# Patient Record
Sex: Female | Born: 1985 | Race: Black or African American | Hispanic: No | Marital: Married | Smoking: Never smoker
Health system: Southern US, Community
[De-identification: ages and names within clinical notes are randomized; demographics above are authoritative.]

## PROBLEM LIST (undated history)

## (undated) ENCOUNTER — Inpatient Hospital Stay (HOSPITAL_COMMUNITY): Payer: Self-pay

## (undated) DIAGNOSIS — A6004 Herpesviral vulvovaginitis: Secondary | ICD-10-CM

## (undated) DIAGNOSIS — F32A Depression, unspecified: Secondary | ICD-10-CM

## (undated) DIAGNOSIS — R519 Headache, unspecified: Secondary | ICD-10-CM

## (undated) DIAGNOSIS — F419 Anxiety disorder, unspecified: Secondary | ICD-10-CM

## (undated) DIAGNOSIS — R87629 Unspecified abnormal cytological findings in specimens from vagina: Secondary | ICD-10-CM

## (undated) DIAGNOSIS — N871 Moderate cervical dysplasia: Secondary | ICD-10-CM

## (undated) DIAGNOSIS — N870 Mild cervical dysplasia: Secondary | ICD-10-CM

## (undated) HISTORY — DX: Depression, unspecified: F32.A

## (undated) HISTORY — PX: DILATION AND CURETTAGE, DIAGNOSTIC / THERAPEUTIC: SUR384

## (undated) HISTORY — DX: Anxiety disorder, unspecified: F41.9

## (undated) HISTORY — DX: Unspecified abnormal cytological findings in specimens from vagina: R87.629

## (undated) HISTORY — DX: Headache, unspecified: R51.9

## (undated) HISTORY — DX: Moderate cervical dysplasia: N87.1

---
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
## 2005-09-15 ENCOUNTER — Emergency Department (HOSPITAL_COMMUNITY): Admission: EM | Admit: 2005-09-15 | Discharge: 2005-09-16 | Payer: Self-pay | Admitting: Emergency Medicine

## 2006-11-07 IMAGING — CR DG KNEE COMPLETE 4+V*R*
4 series · 4 of 4 positions shown · non-contrast
Comparison: none

CLINICAL DATA: Knee pinned between 2 cars.  Crushing injury.  Lateral knee pain.  
 RIGHT KNEE - 4 VIEW:
 There is no evidence of fracture, dislocation or joint effusion.  There is no evidence of arthropathy or other focal bone abnormality.  Soft tissues are unremarkable.

[t knee ap right]
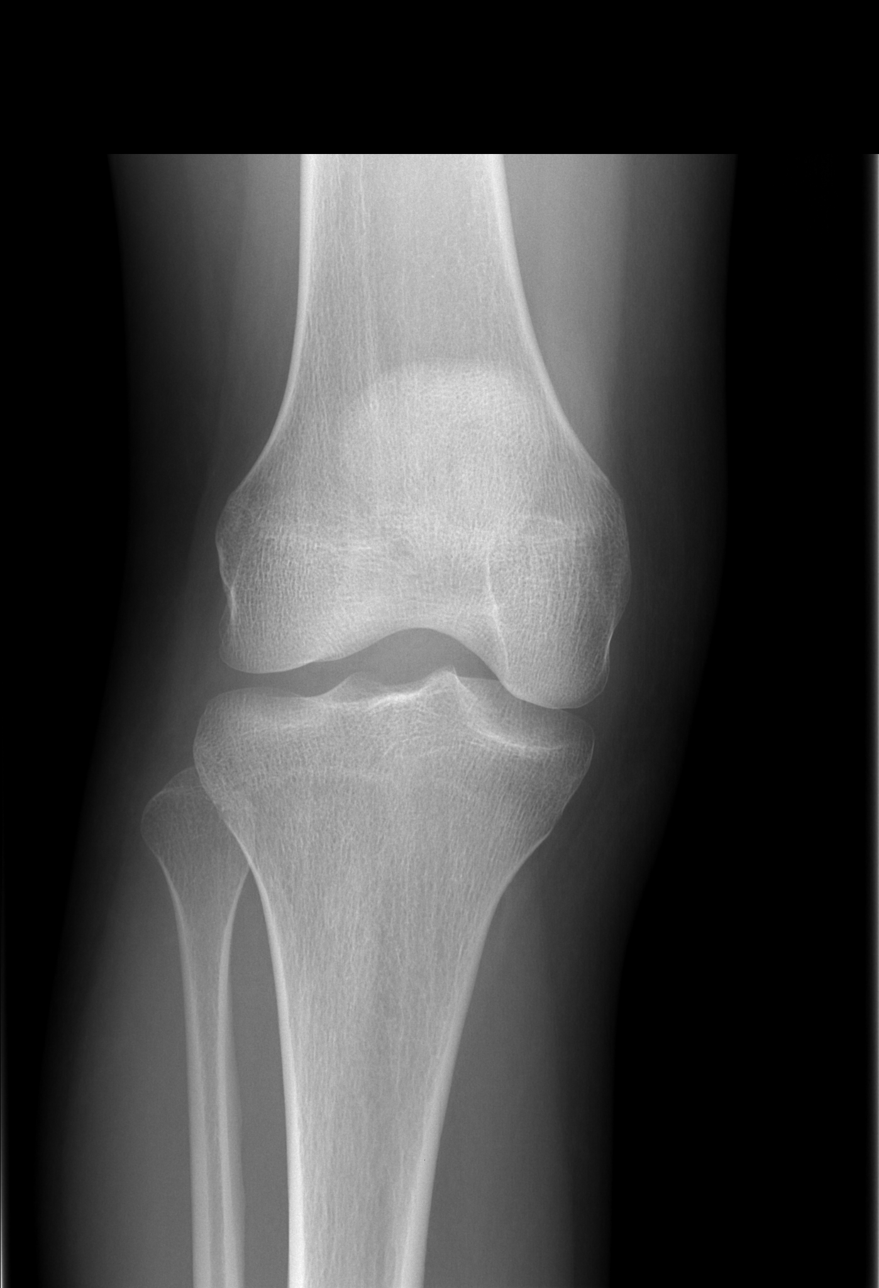

[t knee oblique right (1 of 2)]
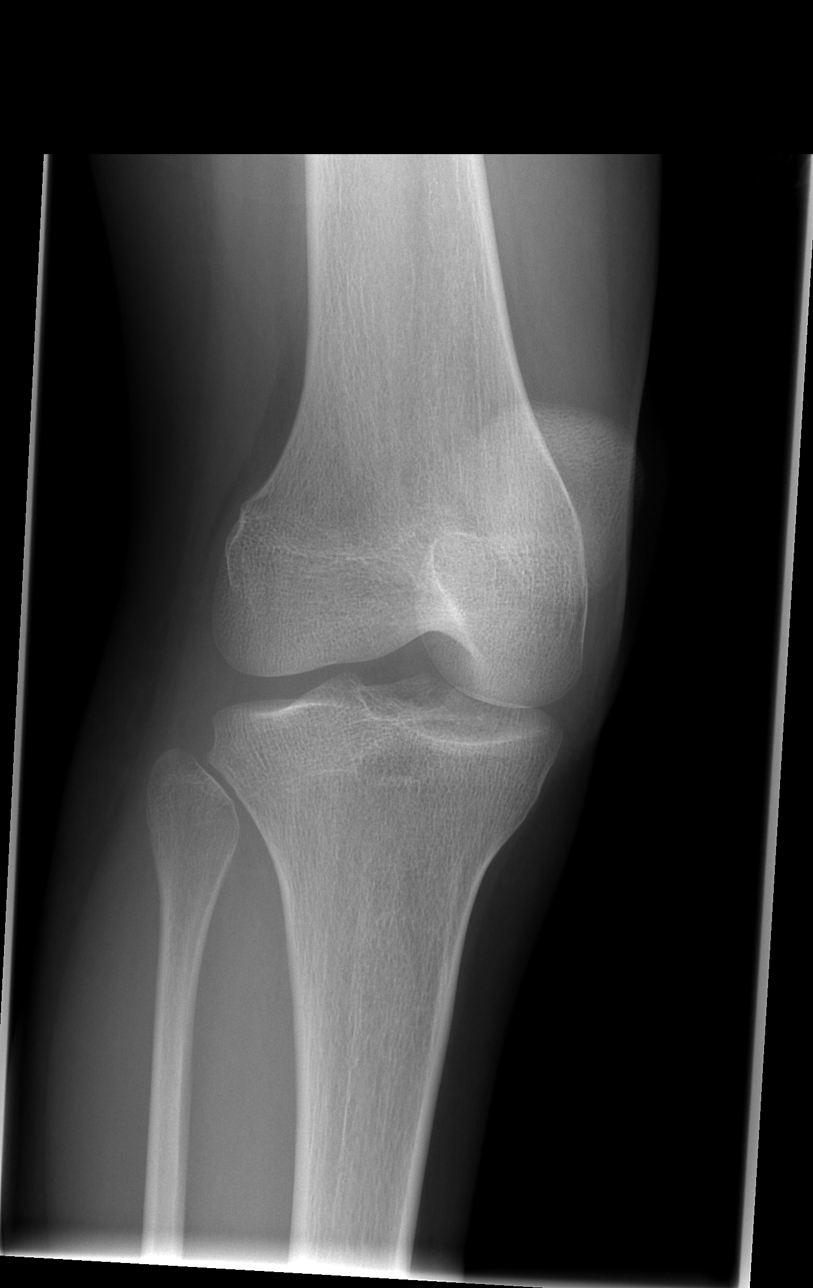

[t knee oblique right (2 of 2)]
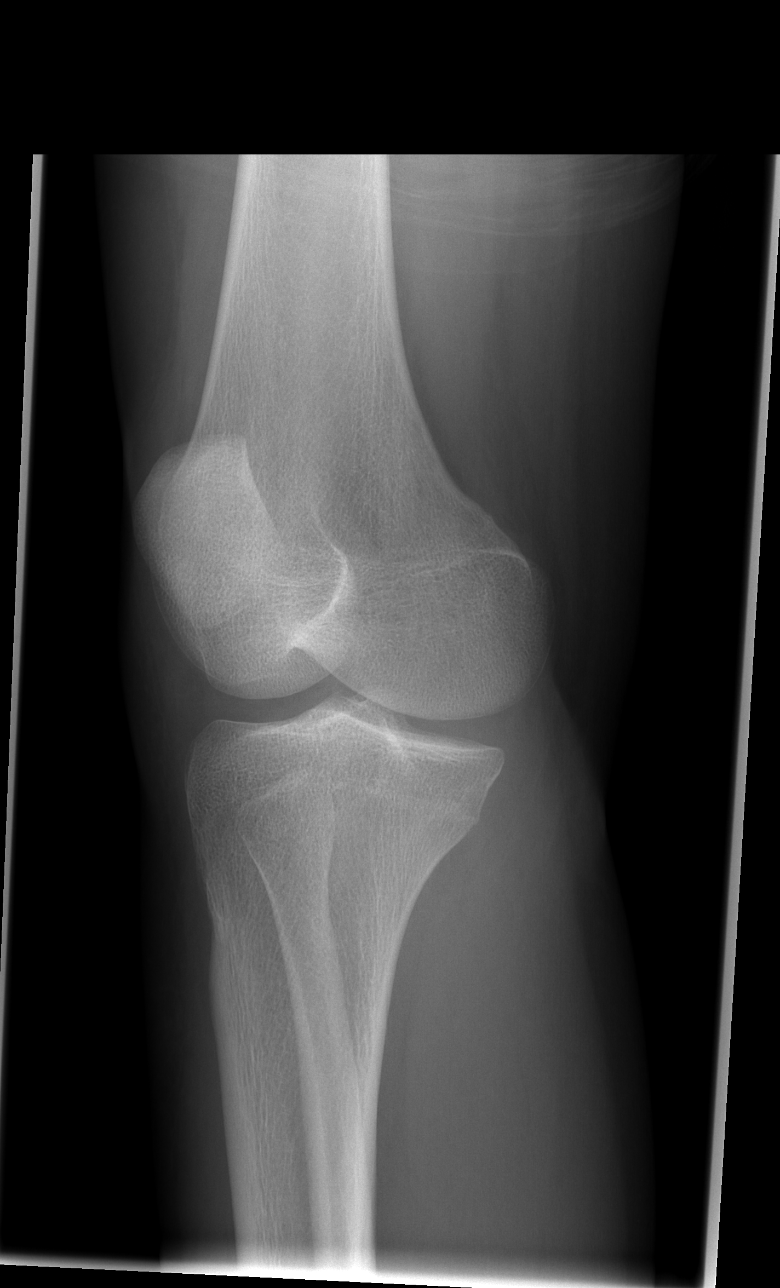

[t knee lat right]
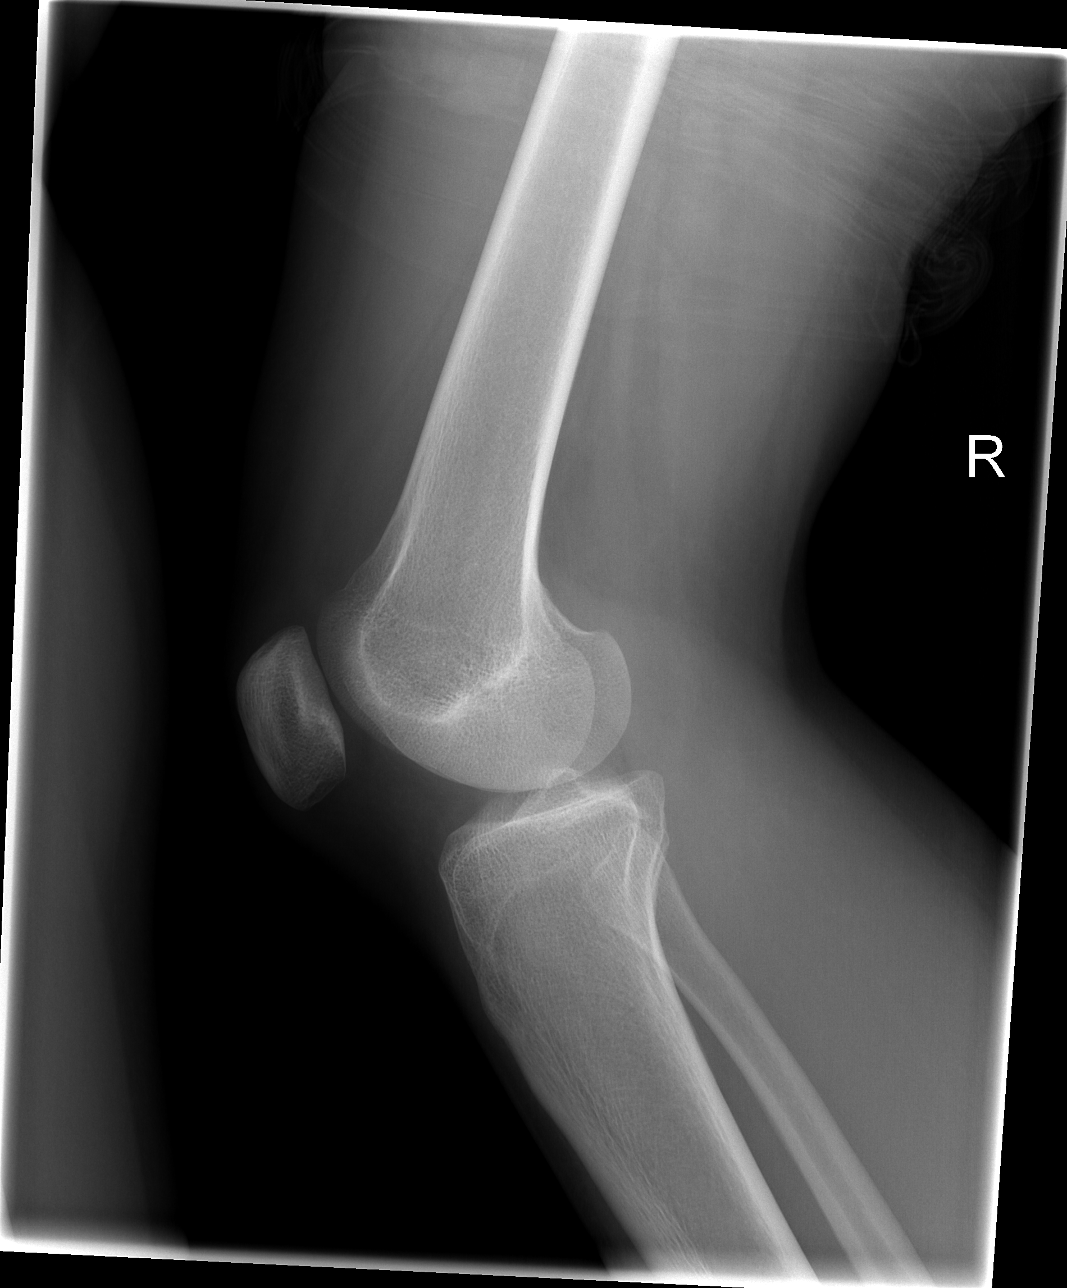

[4 of 4 positions shown; findings below may reference images not displayed]

IMPRESSION: Negative.

## 2006-12-25 ENCOUNTER — Emergency Department (HOSPITAL_COMMUNITY): Admission: EM | Admit: 2006-12-25 | Discharge: 2006-12-25 | Payer: Self-pay | Admitting: *Deleted

## 2008-08-27 ENCOUNTER — Emergency Department (HOSPITAL_COMMUNITY): Admission: EM | Admit: 2008-08-27 | Discharge: 2008-08-27 | Payer: Self-pay | Admitting: Emergency Medicine

## 2008-10-23 ENCOUNTER — Emergency Department (HOSPITAL_COMMUNITY): Admission: EM | Admit: 2008-10-23 | Discharge: 2008-10-23 | Payer: Self-pay | Admitting: Emergency Medicine

## 2008-12-13 ENCOUNTER — Emergency Department (HOSPITAL_COMMUNITY): Admission: EM | Admit: 2008-12-13 | Discharge: 2008-12-13 | Payer: Self-pay | Admitting: Emergency Medicine

## 2008-12-15 ENCOUNTER — Emergency Department (HOSPITAL_COMMUNITY): Admission: EM | Admit: 2008-12-15 | Discharge: 2008-12-15 | Payer: Self-pay | Admitting: Emergency Medicine

## 2009-03-15 ENCOUNTER — Emergency Department (HOSPITAL_COMMUNITY): Admission: EM | Admit: 2009-03-15 | Discharge: 2009-03-16 | Payer: Self-pay | Admitting: Emergency Medicine

## 2009-08-10 ENCOUNTER — Ambulatory Visit: Payer: Self-pay | Admitting: Obstetrics and Gynecology

## 2009-08-10 ENCOUNTER — Encounter (INDEPENDENT_AMBULATORY_CARE_PROVIDER_SITE_OTHER): Payer: Self-pay | Admitting: *Deleted

## 2009-08-10 LAB — CONVERTED CEMR LAB

## 2009-08-13 ENCOUNTER — Encounter (INDEPENDENT_AMBULATORY_CARE_PROVIDER_SITE_OTHER): Payer: Self-pay | Admitting: *Deleted

## 2009-08-13 LAB — CONVERTED CEMR LAB
Trich, Wet Prep: NONE SEEN
Yeast Wet Prep HPF POC: NONE SEEN

## 2009-10-05 ENCOUNTER — Other Ambulatory Visit: Admission: RE | Admit: 2009-10-05 | Discharge: 2009-10-05 | Payer: Self-pay | Admitting: Obstetrics and Gynecology

## 2009-10-05 ENCOUNTER — Ambulatory Visit: Payer: Self-pay | Admitting: Obstetrics and Gynecology

## 2009-10-26 ENCOUNTER — Ambulatory Visit: Payer: Self-pay | Admitting: Family Medicine

## 2009-12-07 ENCOUNTER — Inpatient Hospital Stay (HOSPITAL_COMMUNITY): Admission: AD | Admit: 2009-12-07 | Discharge: 2009-12-07 | Payer: Self-pay | Admitting: Obstetrics and Gynecology

## 2009-12-27 LAB — HEPATITIS B SURFACE ANTIGEN: Hepatitis B Surface Ag: NEGATIVE

## 2009-12-27 LAB — RPR: RPR: NONREACTIVE

## 2009-12-27 LAB — ABO/RH

## 2010-01-13 NOTE — L&D Delivery Note (Signed)
Delivery Note At 11:57 AM a viable unspecified sex was delivered via Vaginal, Spontaneous Delivery (OA  ).  APGAR:9 , 9; weight .   Placenta status: , .  Cord:  with the following complications: .  Cord pH: none  Anesthesia: Epidural  Episiotomy: None Lacerations: none Suture Repair: none Est. Blood Loss (mL):   Mom to postpartum.  Baby to nursery-stable.  Lisandro Meggett A 08/08/2010, 12:14 AM

## 2010-03-26 LAB — URINALYSIS, ROUTINE W REFLEX MICROSCOPIC
Hgb urine dipstick: NEGATIVE
Nitrite: NEGATIVE
Urobilinogen, UA: 0.2 mg/dL (ref 0.0–1.0)
pH: 6 (ref 5.0–8.0)

## 2010-03-26 LAB — POCT PREGNANCY, URINE: Preg Test, Ur: POSITIVE

## 2010-03-28 LAB — POCT PREGNANCY, URINE: Preg Test, Ur: NEGATIVE

## 2010-04-02 LAB — STREP B DNA PROBE: GBS: POSITIVE

## 2010-04-16 LAB — RAPID STREP SCREEN (MED CTR MEBANE ONLY): Streptococcus, Group A Screen (Direct): NEGATIVE

## 2010-04-18 LAB — RPR: RPR Ser Ql: NONREACTIVE

## 2010-04-28 ENCOUNTER — Inpatient Hospital Stay (HOSPITAL_COMMUNITY)
Admission: AD | Admit: 2010-04-28 | Discharge: 2010-04-28 | Disposition: A | Discharge status: Home / Self Care | Payer: BC Managed Care – HMO | Source: Ambulatory Visit | Attending: Obstetrics & Gynecology | Admitting: Obstetrics & Gynecology

## 2010-04-28 DIAGNOSIS — O368190 Decreased fetal movements, unspecified trimester, not applicable or unspecified: Secondary | ICD-10-CM | POA: Insufficient documentation

## 2010-05-15 LAB — RPR: RPR: NONREACTIVE

## 2010-08-06 ENCOUNTER — Inpatient Hospital Stay (HOSPITAL_COMMUNITY)
Admission: AD | Admit: 2010-08-06 | Discharge: 2010-08-09 | DRG: 775 | Disposition: A | Discharge status: Home / Self Care | Payer: Medicaid Other | Source: Ambulatory Visit | Attending: Obstetrics and Gynecology | Admitting: Obstetrics and Gynecology

## 2010-08-06 ENCOUNTER — Encounter (HOSPITAL_COMMUNITY): Payer: Self-pay | Admitting: *Deleted

## 2010-08-06 DIAGNOSIS — O99892 Other specified diseases and conditions complicating childbirth: Secondary | ICD-10-CM | POA: Diagnosis present

## 2010-08-06 DIAGNOSIS — O480 Post-term pregnancy: Secondary | ICD-10-CM | POA: Insufficient documentation

## 2010-08-06 DIAGNOSIS — Z22330 Carrier of Group B streptococcus: Secondary | ICD-10-CM

## 2010-08-06 HISTORY — DX: Mild cervical dysplasia: N87.0

## 2010-08-06 HISTORY — DX: Herpesviral vulvovaginitis: A60.04

## 2010-08-06 LAB — CBC
Hemoglobin: 12.4 g/dL (ref 12.0–15.0)
RBC: 4.37 MIL/uL (ref 3.87–5.11)

## 2010-08-06 MED ORDER — LACTATED RINGERS IV SOLN
INTRAVENOUS | Status: DC
  Administered 2010-08-07: 07:00:00 via INTRAVENOUS

## 2010-08-06 MED ORDER — IBUPROFEN 600 MG PO TABS: 600.0000 mg | ORAL_TABLET | Freq: Four times a day (QID) | ORAL | Status: DC | PRN

## 2010-08-06 MED ORDER — FLEET ENEMA 7-19 GM/118ML RE ENEM: 1.0000 | ENEMA | RECTAL | Status: DC | PRN

## 2010-08-06 MED ORDER — PENICILLIN G POTASSIUM 5000000 UNITS IJ SOLR
5.0000 10*6.[IU] | Freq: Once | INTRAVENOUS | Status: DC
  Filled 2010-08-06: qty 5

## 2010-08-06 MED ORDER — ACETAMINOPHEN 325 MG PO TABS: 650.0000 mg | ORAL_TABLET | ORAL | Status: DC | PRN

## 2010-08-06 MED ORDER — ZOLPIDEM TARTRATE 10 MG PO TABS: 10.0000 mg | ORAL_TABLET | Freq: Every evening | ORAL | Status: DC | PRN

## 2010-08-06 MED ORDER — PENICILLIN G POTASSIUM 5000000 UNITS IJ SOLR
2.5000 10*6.[IU] | INTRAVENOUS | Status: DC
  Filled 2010-08-06 (×5): qty 2.5

## 2010-08-06 MED ORDER — MISOPROSTOL 25 MCG QUARTER TABLET
25.0000 ug | ORAL_TABLET | ORAL | Status: DC | PRN
  Administered 2010-08-06 – 2010-08-07 (×2): 25 ug via VAGINAL
  Filled 2010-08-06 (×2): qty 0.25

## 2010-08-06 MED ORDER — LACTATED RINGERS IV SOLN: 500.0000 mL | INTRAVENOUS | Status: DC | PRN

## 2010-08-06 MED ORDER — SODIUM CHLORIDE 0.9 % IJ SOLN
3.0000 mL | Freq: Two times a day (BID) | INTRAMUSCULAR | Status: DC
  Administered 2010-08-06 – 2010-08-07 (×2): 3 mL via INTRAVENOUS

## 2010-08-06 MED ORDER — OXYCODONE-ACETAMINOPHEN 5-325 MG PO TABS: 2.0000 | ORAL_TABLET | ORAL | Status: DC | PRN

## 2010-08-06 MED ORDER — LIDOCAINE HCL (PF) 1 % IJ SOLN: 30.0000 mL | INTRAMUSCULAR | Status: DC | PRN

## 2010-08-06 MED ORDER — OXYTOCIN 20 UNITS IN LACTATED RINGERS INFUSION - SIMPLE
1.0000 m[IU]/min | INTRAVENOUS | Status: DC
  Administered 2010-08-07: 14 m[IU]/min via INTRAVENOUS
  Administered 2010-08-07: 2 m[IU]/min via INTRAVENOUS
  Administered 2010-08-08: 333 m[IU]/min via INTRAVENOUS
  Filled 2010-08-06: qty 1000

## 2010-08-06 MED ORDER — TERBUTALINE SULFATE 1 MG/ML IJ SOLN: 0.2500 mg | Freq: Once | INTRAMUSCULAR | Status: AC | PRN

## 2010-08-06 MED ORDER — CITRIC ACID-SODIUM CITRATE 334-500 MG/5ML PO SOLN: 30.0000 mL | ORAL | Status: DC | PRN

## 2010-08-06 MED ORDER — ONDANSETRON HCL 4 MG/2ML IJ SOLN: 4.0000 mg | Freq: Four times a day (QID) | INTRAMUSCULAR | Status: DC | PRN

## 2010-08-06 NOTE — Plan of Care (Signed)
Problem: Consults Goal: Birthing Suites Patient Information Press F2 to bring up selections list Outcome: Completed/Met Date Met:  08/06/10  Pt > [redacted] weeks EGA and Inpatient induction

## 2010-08-07 ENCOUNTER — Inpatient Hospital Stay (HOSPITAL_COMMUNITY): Payer: Medicaid Other | Admitting: Anesthesiology

## 2010-08-07 ENCOUNTER — Encounter (HOSPITAL_COMMUNITY): Payer: Self-pay | Admitting: Anesthesiology

## 2010-08-07 ENCOUNTER — Encounter (HOSPITAL_COMMUNITY): Payer: Self-pay | Admitting: Obstetrics and Gynecology

## 2010-08-07 LAB — RPR: RPR Ser Ql: NONREACTIVE

## 2010-08-07 MED ORDER — PENICILLIN G POTASSIUM 5000000 UNITS IJ SOLR
2.5000 10*6.[IU] | INTRAVENOUS | Status: DC
  Administered 2010-08-07: 2.5 10*6.[IU] via INTRAVENOUS
  Filled 2010-08-07 (×2): qty 2.5

## 2010-08-07 MED ORDER — PHENYLEPHRINE 40 MCG/ML (10ML) SYRINGE FOR IV PUSH (FOR BLOOD PRESSURE SUPPORT)
80.0000 ug | PREFILLED_SYRINGE | INTRAVENOUS | Status: DC | PRN
  Filled 2010-08-07 (×2): qty 5

## 2010-08-07 MED ORDER — LACTATED RINGERS IV SOLN: 500.0000 mL | Freq: Once | INTRAVENOUS | Status: DC

## 2010-08-07 MED ORDER — IBUPROFEN 600 MG PO TABS: 600.0000 mg | ORAL_TABLET | Freq: Four times a day (QID) | ORAL | Status: DC | PRN

## 2010-08-07 MED ORDER — CITRIC ACID-SODIUM CITRATE 334-500 MG/5ML PO SOLN: 30.0000 mL | ORAL | Status: DC | PRN

## 2010-08-07 MED ORDER — PENICILLIN G POTASSIUM 5000000 UNITS IJ SOLR
2.5000 10*6.[IU] | INTRAVENOUS | Status: DC
  Administered 2010-08-07 (×2): 2.5 10*6.[IU] via INTRAVENOUS
  Filled 2010-08-07 (×7): qty 2.5

## 2010-08-07 MED ORDER — PHENYLEPHRINE 40 MCG/ML (10ML) SYRINGE FOR IV PUSH (FOR BLOOD PRESSURE SUPPORT)
80.0000 ug | PREFILLED_SYRINGE | INTRAVENOUS | Status: DC | PRN
  Filled 2010-08-07: qty 5

## 2010-08-07 MED ORDER — EPHEDRINE 5 MG/ML INJ
10.0000 mg | INTRAVENOUS | Status: DC | PRN
  Filled 2010-08-07 (×2): qty 4

## 2010-08-07 MED ORDER — OXYTOCIN 10 UNIT/ML IJ SOLN
INTRAMUSCULAR | Status: AC
  Filled 2010-08-07: qty 2

## 2010-08-07 MED ORDER — LACTATED RINGERS IV SOLN
500.0000 mL | INTRAVENOUS | Status: DC | PRN
  Administered 2010-08-07: 500 mL via INTRAVENOUS

## 2010-08-07 MED ORDER — ACETAMINOPHEN 325 MG PO TABS: 650.0000 mg | ORAL_TABLET | ORAL | Status: DC | PRN

## 2010-08-07 MED ORDER — PENICILLIN G POTASSIUM 5000000 UNITS IJ SOLR: 2.5000 10*6.[IU] | INTRAVENOUS | Status: DC

## 2010-08-07 MED ORDER — PENICILLIN G POTASSIUM 5000000 UNITS IJ SOLR: 5.0000 10*6.[IU] | Freq: Once | INTRAVENOUS | Status: DC

## 2010-08-07 MED ORDER — LIDOCAINE HCL (PF) 1 % IJ SOLN
30.0000 mL | INTRAMUSCULAR | Status: DC | PRN
  Filled 2010-08-07 (×2): qty 30

## 2010-08-07 MED ORDER — FENTANYL 2.5 MCG/ML BUPIVACAINE 1/10 % EPIDURAL INFUSION (WH - ANES)
14.0000 mL/h | INTRAMUSCULAR | Status: DC
  Administered 2010-08-07: 14 mL/h via EPIDURAL
  Filled 2010-08-07: qty 60

## 2010-08-07 MED ORDER — OXYTOCIN 20 UNITS IN LACTATED RINGERS INFUSION - SIMPLE
125.0000 mL/h | Freq: Once | INTRAVENOUS | Status: DC
  Filled 2010-08-07: qty 1000

## 2010-08-07 MED ORDER — ONDANSETRON HCL 4 MG/2ML IJ SOLN: 4.0000 mg | Freq: Four times a day (QID) | INTRAMUSCULAR | Status: DC | PRN

## 2010-08-07 MED ORDER — OXYCODONE-ACETAMINOPHEN 5-325 MG PO TABS: 2.0000 | ORAL_TABLET | ORAL | Status: DC | PRN

## 2010-08-07 MED ORDER — LACTATED RINGERS IV SOLN
INTRAVENOUS | Status: DC
  Administered 2010-08-07: 59 mL/h via INTRAVENOUS

## 2010-08-07 MED ORDER — PENICILLIN G POTASSIUM 5000000 UNITS IJ SOLR
5.0000 10*6.[IU] | Freq: Once | INTRAMUSCULAR | Status: DC
  Administered 2010-08-07: 5 10*6.[IU] via INTRAVENOUS
  Filled 2010-08-07: qty 5

## 2010-08-07 MED ORDER — EPHEDRINE 5 MG/ML INJ
10.0000 mg | INTRAVENOUS | Status: DC | PRN
  Filled 2010-08-07: qty 4

## 2010-08-07 MED ORDER — DIPHENHYDRAMINE HCL 50 MG/ML IJ SOLN: 12.5000 mg | INTRAMUSCULAR | Status: DC | PRN

## 2010-08-07 NOTE — Progress Notes (Signed)
S: comfortable after epidural. Pitocin 18 mIU  O: VE; (9:35 pm  5-6 cm/80%/-1 Pt on left lateral exaggerated sims  Tracing baseline 130 reactive. Ctx q 2-3 mins  A/p; Active phase Postdate GBS cx (+)  P) increase pitocin to 20 MIU  cont PCN

## 2010-08-07 NOTE — H&P (Signed)
Sherry [name] is a 25 y.o. female presenting for induction of labor 2nd to postdates. (+) GBS cx. 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 No pertinent past medical history    Past Surgical History  Procedure Date  . No past surgeries    Family History: family history is not on file. Social History:  reports that she has never smoked. She has never used smokeless tobacco. She reports that she does not drink alcohol or use illicit drugs.  Review of Systems  Genitourinary: Negative.   All other systems reviewed and are negative.    Dilation: 1.5 Effacement (%): 50 Station: -2 Exam by:: a. harper, rnc-ob Blood pressure 115/74, pulse 100, temperature 97.8 F (36.6 C), temperature source Oral, resp. rate 18, height 5\' 9"  (1.753 m), weight 110.678 kg (244 lb), last menstrual period 10/25/2009. Maternal Exam:  Abdomen: Patient reports no abdominal tenderness. Fundal height is term.   Estimated fetal weight is 8 1/2 lb.   Fetal presentation: vertex  Introitus: Normal vulva. Ferning test: not done.  Nitrazine test: not done.  Pelvis: adequate for delivery.   Cervix: Cervix evaluated by digital exam.     Physical Exam  Constitutional: She is oriented to person, place, and time. She appears well-developed and well-nourished.  HENT:  Head: Normocephalic.  Eyes: EOM are normal.  Neck: Normal range of motion.  Cardiovascular: Normal rate and regular rhythm.   Respiratory: Breath sounds normal.  GI: Soft.  Musculoskeletal: Normal range of motion.  Neurological: She is alert and oriented to person, place, and time.  Skin: Skin is warm.    Prenatal labs: ABO, Rh:  AB positive Antibody: Negative (12/15 0000) Rubella:  Indeterminate RPR: NON REACTIVE (07/24 2100)  HBsAg:  Negative (12/15 0000)  HIV: NON REAC (07/29 1959)  GBS: Positive (03/20 0000)   Assessment/Plan: Postdate GBS cx (+) P ) Admit. cytotec induction, routine labs. Analgesic. PCN prophylaxis. D/c Valtrex. Rubella vaccine postpartum  Sherry [name] A 08/07/2010, 5:34 AM    ABA

## 2010-08-07 NOTE — Progress Notes (Signed)
Sherry [name] is a 25 y.o. G2P0010 at [redacted]w[redacted]d by LMP admitted for induction of labor due to Post dates. Due date 08/01/2010. Pt has had cytotec  Subjective: No chief complaint on file.   Objective: BP 115/74  Pulse 100  Temp(Src) 97.9 F (36.6 C) (Oral)  Resp 18  Ht 5\' 9"  (1.753 m)  Wt 110.678 kg (244 lb)  BMI 36.03 kg/m2  LMP 10/25/2009      FHT:  Tracing: reactive baseline 140's (+) accel . irreg ctx UC:   irregular, every 2-3 minutes SVE:   3 cm dilated/70%/-2 left lateral  Labs: Lab Results  Component Value Date   WBC 12.1* 08/06/2010   HGB 12.4 08/06/2010   HCT 37.6 08/06/2010   MCV 86.0 08/06/2010   PLT 307 08/06/2010    Assessment / Plan: Postdates. 2) latent phase 3) GBS cx (+)  Labor: early Preeclampsia:  no signs or symptoms of toxicity Fetal Wellbeing:  Category I Pain Control:  n/a I/D:  n/a Anticipated MOD:  NSVD P) start pitocin. 2) cont PCN  Sherry [name] A 08/07/2010, 6:00 AM

## 2010-08-07 NOTE — Anesthesia Preprocedure Evaluation (Addendum)
Anesthesia Evaluation  Name, MR# and DOB Patient awake  General Assessment Comment  Reviewed: Allergy & Precautions, H&P  and Patient's Chart, lab work & pertinent test results  Airway Mallampati: II TM Distance: >3 FB Neck ROM: full    Dental  (+) Teeth Intact   Pulmonary  clear to auscultation    Cardiovascular regular Normal   Neuro/Psych  GI/Hepatic/Renal   Endo/Other   (+)  Morbid obesity Abdominal   Musculoskeletal  Hematology   Peds  Reproductive/Obstetrics (+) Pregnancy   Anesthesia Other Findings                 Anesthesia Physical Anesthesia Plan  ASA: III  Anesthesia Plan: Epidural   Post-op Pain Management:    Induction:   Airway Management Planned:   Additional Equipment:   Intra-op Plan:   Post-operative Plan:   Informed Consent: I have reviewed the patients History and Physical, chart, labs and discussed the procedure including the risks, benefits and alternatives for the proposed anesthesia with the patient or authorized representative who has indicated his/her understanding and acceptance.   Dental Advisory Given  Plan Discussed with: CRNA and Surgeon  Anesthesia Plan Comments: (Labs checked- platelets confirmed with RN in room. Fetal heart tracing, per RN, reportedly stable enough for sitting procedure. Discussed epidural, and patient consents to the procedure:  included risk of possible headache,backache, failed block, allergic reaction, and nerve injury. This patient was asked if she had any questions or concerns before the procedure started. )       Anesthesia Quick Evaluation

## 2010-08-07 NOTE — Progress Notes (Signed)
S: comfortable. Pitocin 18 MU  VE; unchanged. AROM clear fluid  IUPC placed  Tracing: baseline 140 reactive. (+) accel ctx q 2-  A/P. Arrest of dilation  P) cont pitocin. Assess ut ctx strength. Cont PCN

## 2010-08-07 NOTE — Anesthesia Procedure Notes (Addendum)
Epidural Patient location during procedure: OB Start time: 08/07/2010 8:34 PM  Staffing Anesthesiologist: Jiles Garter  Preanesthetic Checklist Completed: patient identified, site marked, surgical consent, pre-op evaluation, timeout performed, IV checked, risks and benefits discussed and monitors and equipment checked  Epidural Patient position: sitting Prep: site prepped and draped and DuraPrep Patient monitoring: continuous pulse ox and blood pressure Approach: midline Injection technique: LOR air  Needle:  Needle type: Tuohy  Needle gauge: 17 G Needle length: 9 cm Needle insertion depth: 7 cm Catheter type: closed end flexible Catheter size: 19 Gauge Catheter at skin depth: 14 cm Test dose: negative  Assessment Events: blood not aspirated, injection not painful, no injection resistance, negative IV test and no paresthesia  Additional Notes Dosing of Epidural: 1st dose, Through needle...... 5mg  Marcaine 2nd dose, through catheter.... epi 1:200K + Xylocaine 40 mg 3rd dose, through catheter...Marland KitchenMarland Kitchenepi 1:200K + Xylocaine 60 mg Each dose occurred after waiting 3 min,patient was free of IV sx; and patient exhibits no evidence of SA injection  Patient is more comfortable after epidural dosed. Please see RN's note for documentation of vital signs,and FHR which are stable.

## 2010-08-07 NOTE — Progress Notes (Signed)
  S: notes some painful ctx. Pitocin 14 MU  O:  VE tight 4 cm/80%/-2 intact. \\  Tracing baseline 140 (+) accels. Ctx q 2-  A/P:Postdate Latent phase GBS cx (+)  P) cont increase pitocin   AROM prn

## 2010-08-08 ENCOUNTER — Encounter (HOSPITAL_COMMUNITY): Payer: Self-pay | Admitting: Obstetrics and Gynecology

## 2010-08-08 LAB — CBC
HCT: 37.2 % (ref 36.0–46.0)
Hemoglobin: 12.4 g/dL (ref 12.0–15.0)
MCV: 85.5 fL (ref 78.0–100.0)
Platelets: 285 10*3/uL (ref 150–400)
RBC: 4.35 MIL/uL (ref 3.87–5.11)
WBC: 16.6 10*3/uL — ABNORMAL HIGH (ref 4.0–10.5)

## 2010-08-08 MED ORDER — DIPHENHYDRAMINE HCL 25 MG PO CAPS: 25.0000 mg | ORAL_CAPSULE | Freq: Four times a day (QID) | ORAL | Status: DC | PRN

## 2010-08-08 MED ORDER — OXYTOCIN 20 UNITS IN LACTATED RINGERS INFUSION - SIMPLE: 125.0000 mL/h | INTRAVENOUS | Status: DC | PRN

## 2010-08-08 MED ORDER — ONDANSETRON HCL 4 MG/2ML IJ SOLN: 4.0000 mg | INTRAMUSCULAR | Status: DC | PRN

## 2010-08-08 MED ORDER — BENZOCAINE-MENTHOL 20-0.5 % EX AERO: 1.0000 "application " | INHALATION_SPRAY | CUTANEOUS | Status: DC | PRN

## 2010-08-08 MED ORDER — ONDANSETRON HCL 4 MG PO TABS
4.0000 mg | ORAL_TABLET | ORAL | Status: DC | PRN
Start: 2010-08-08 — End: 2010-08-09

## 2010-08-08 MED ORDER — SODIUM CHLORIDE 0.9 % IJ SOLN: 3.0000 mL | Freq: Two times a day (BID) | INTRAMUSCULAR | Status: DC

## 2010-08-08 MED ORDER — SENNOSIDES-DOCUSATE SODIUM 8.6-50 MG PO TABS
1.0000 | ORAL_TABLET | Freq: Every day | ORAL | Status: DC
  Administered 2010-08-08: 1 via ORAL

## 2010-08-08 MED ORDER — OXYCODONE-ACETAMINOPHEN 5-325 MG PO TABS
1.0000 | ORAL_TABLET | ORAL | Status: DC | PRN
  Administered 2010-08-08: 1 via ORAL
  Filled 2010-08-08: qty 1

## 2010-08-08 MED ORDER — SODIUM CHLORIDE 0.9 % IV SOLN: 250.0000 mL | INTRAVENOUS | Status: DC

## 2010-08-08 MED ORDER — SIMETHICONE 80 MG PO CHEW: 80.0000 mg | CHEWABLE_TABLET | ORAL | Status: DC | PRN

## 2010-08-08 MED ORDER — ZOLPIDEM TARTRATE 5 MG PO TABS: 5.0000 mg | ORAL_TABLET | Freq: Every evening | ORAL | Status: DC | PRN

## 2010-08-08 MED ORDER — SODIUM CHLORIDE 0.9 % IJ SOLN: 3.0000 mL | INTRAMUSCULAR | Status: DC | PRN

## 2010-08-08 MED ORDER — IBUPROFEN 600 MG PO TABS
600.0000 mg | ORAL_TABLET | Freq: Four times a day (QID) | ORAL | Status: DC
  Administered 2010-08-08 – 2010-08-09 (×6): 600 mg via ORAL
  Filled 2010-08-08 (×6): qty 1

## 2010-08-08 MED ORDER — MEASLES, MUMPS & RUBELLA VAC ~~LOC~~ INJ
0.5000 mL | INJECTION | Freq: Once | SUBCUTANEOUS | Status: AC
  Administered 2010-08-09: 0.5 mL via SUBCUTANEOUS
  Filled 2010-08-08 (×2): qty 0.5

## 2010-08-08 MED ORDER — FERROUS SULFATE 325 (65 FE) MG PO TABS
325.0000 mg | ORAL_TABLET | Freq: Two times a day (BID) | ORAL | Status: DC
  Administered 2010-08-08 – 2010-08-09 (×3): 325 mg via ORAL
  Filled 2010-08-08 (×3): qty 1

## 2010-08-08 MED ORDER — PRENATAL PLUS 27-1 MG PO TABS
1.0000 | ORAL_TABLET | Freq: Every day | ORAL | Status: DC
  Administered 2010-08-08 – 2010-08-09 (×2): 1 via ORAL
  Filled 2010-08-08 (×2): qty 1

## 2010-08-08 MED ORDER — LANOLIN HYDROUS EX OINT: TOPICAL_OINTMENT | CUTANEOUS | Status: DC | PRN

## 2010-08-08 MED ORDER — WITCH HAZEL-GLYCERIN EX PADS: MEDICATED_PAD | CUTANEOUS | Status: DC | PRN

## 2010-08-08 MED ORDER — MEDROXYPROGESTERONE ACETATE 150 MG/ML IM SUSP: 150.0000 mg | INTRAMUSCULAR | Status: DC | PRN

## 2010-08-08 MED ORDER — TETANUS-DIPHTH-ACELL PERTUSSIS 5-2.5-18.5 LF-MCG/0.5 IM SUSP
0.5000 mL | Freq: Once | INTRAMUSCULAR | Status: AC
  Administered 2010-08-09: 0.5 mL via INTRAMUSCULAR
  Filled 2010-08-08: qty 0.5

## 2010-08-08 NOTE — Progress Notes (Signed)
  PPD # 1  Subjective: Pt reports feeling sore, tired/ Pain controlled with prescription NSAID's including motrin Tolerating po/ Voiding without problems/ No n/v Bleeding is moderate Newborn info:  Information for the patient's newborn:  [name], [name] [[id number]]  female Feeding: breast    Objective:  VS: Blood pressure 111/71, pulse 101, temperature 98.2 F (36.8 C), temperature source Oral, resp. rate 18  Basename 08/08/10 0535 08/06/10 2100  WBC 16.6* 12.1*  HGB 12.4 12.4  HCT 37.2 37.6  PLT 285 307    Blood type:   Rubella: Equivocal (12/15 0000)    Physical Exam:  General: alert, cooperative and no distress CV: Regular rate and rhythm Resp: clear Abdomen: soft, nontender, normal bowel sounds Uterine Fundus: firm, below umbilicus, nontender Perineum: intact and without edema Lochia: moderate Ext: edema trace to +1 and Homans sign is negative, no sign of DVT   A/P: PPD # 1/ G2P0010 Doing well Continue routine post partum orders Anticipate discharge home tomorrow afternoon

## 2010-08-08 NOTE — Progress Notes (Signed)
UR Chart review completed.  

## 2010-08-09 MED ORDER — IBUPROFEN 600 MG PO TABS: 600.0000 mg | ORAL_TABLET | Freq: Four times a day (QID) | ORAL | Status: AC

## 2010-08-09 NOTE — Progress Notes (Signed)
PPD # 2  Subjective: Pt reports feeling well, sore.  Eager for d/c home/ Pain controlled with prescription NSAID's including motrin Tolerating po/ Voiding without problems/ No n/v Bleeding is light/ Newborn info:  Information for the patient's newborn:  [place]e, Dembeck [[id number]]  female  / circ n/a/ Feeding: breast    Objective:  VS: Blood pressure 110/72, pulse 84, temperature 98 F (36.7 C), temperature source Axillary, resp. rate 18  Basename 08/08/10 0535 08/06/10 2100  WBC 16.6* 12.1*  HGB 12.4 12.4  HCT 37.2 37.6  PLT 285 307    Blood type:  AB pos Rubella: Equivocal (12/15 0000)    Physical Exam:  General: alert, cooperative and no distress Abdomen: soft, nontender, normal bowel sounds Uterine Fundus: firm, below umbilicus, nontender Perineum: is normal Lochia: minimal Ext: extremities normal, atraumatic, no cyanosis or edema and Homans sign is negative, no sign of DVT   A/P: PPD # 2/ [id number] Doing well and stable for discharge home RX: Ibuprofen 600mg  po Q 6 hrs prn pain #30 Refill x 1 [facility] booklet given Routine pp visit in 6wks

## 2010-08-09 NOTE — Progress Notes (Signed)
BREASTFEEDING BASICS AND DISCHARGE TEACHING REVIEWED WITH PATIENT.  OBSERVED FEEDING AND BABY LATCHES EASILY AND WELL.  BREASTS FILLING.  INSTRUCTED ON WAKING TECHNIQUES AND BREAST MASSAGE.  ENCOURAGED TO CALL LC OFFICE PRN.

## 2010-08-09 NOTE — Discharge Summary (Signed)
  Obstetric Discharge Summary Reason for Admission: induction of labor  Prenatal Labs: ABO, Rh:  AB pos Antibody: Negative (12/15 0000) Rubella:  Indeterminate/MMR before d/c home RPR: NON REACTIVE (07/24 2100)  HBsAg: Negative (12/15 0000)  HIV: NON REAC (07/29 1959)  GBS: Positive (03/20 0000)   Labor Summary: Delivery of viable female infant via SVD by Dr Cherly Hensen on 08/07/2010 Anesthesia: epidural Procedures: none Complications: none  Newborn Data:  Gender: female Feeding method : breast Circumcision: n/a Home with mother.  Discharge Information: Date: 08/09/2010 Discharge Diagnoses: Term Pregnancy-delivered  Activity: pelvic rest Diet: routine Medications: Ibuprophen Condition: stable Instructions: refer to practice specific booklet Discharge to: home Follow up : [facility] at 6 weeks postpartum

## 2010-08-13 NOTE — Anesthesia Postprocedure Evaluation (Signed)
  Anesthesia Post-op Note  Patient: Sherry [name] This patient has recovered from her labor epidural, and I am not aware of any complications or problems.

## 2010-08-19 ENCOUNTER — Ambulatory Visit (HOSPITAL_COMMUNITY): Payer: Medicaid Other

## 2010-10-18 ENCOUNTER — Ambulatory Visit (INDEPENDENT_AMBULATORY_CARE_PROVIDER_SITE_OTHER): Payer: Self-pay | Admitting: *Deleted

## 2010-10-18 VITALS — BP 116/74 | Temp 98.5°F

## 2010-10-18 DIAGNOSIS — Z23 Encounter for immunization: Secondary | ICD-10-CM

## 2010-10-18 MED ORDER — INFLUENZA VIRUS VACC SPLIT PF IM SUSP
0.5000 mL | Freq: Once | INTRAMUSCULAR | Status: AC
  Administered 2010-10-18: 0.5 mL via INTRAMUSCULAR

## 2010-10-21 LAB — URINALYSIS, ROUTINE W REFLEX MICROSCOPIC
Bilirubin Urine: NEGATIVE
Hgb urine dipstick: NEGATIVE
Nitrite: NEGATIVE
Protein, ur: NEGATIVE
Urobilinogen, UA: 2 — ABNORMAL HIGH

## 2010-10-21 LAB — WET PREP, GENITAL: Trich, Wet Prep: NONE SEEN

## 2010-10-21 LAB — POCT PREGNANCY, URINE
Operator id: 272551
Preg Test, Ur: NEGATIVE

## 2010-10-21 LAB — URINE CULTURE: Colony Count: 8000

## 2010-10-21 LAB — URINE MICROSCOPIC-ADD ON

## 2010-11-15 ENCOUNTER — Ambulatory Visit (INDEPENDENT_AMBULATORY_CARE_PROVIDER_SITE_OTHER): Payer: PRIVATE HEALTH INSURANCE | Admitting: Obstetrics and Gynecology

## 2010-11-15 ENCOUNTER — Other Ambulatory Visit: Payer: Self-pay | Admitting: Obstetrics and Gynecology

## 2010-11-15 ENCOUNTER — Encounter: Payer: Self-pay | Admitting: Obstetrics and Gynecology

## 2010-11-15 ENCOUNTER — Other Ambulatory Visit (HOSPITAL_COMMUNITY)
Admission: RE | Admit: 2010-11-15 | Discharge: 2010-11-15 | Disposition: A | Discharge status: Home / Self Care | Payer: PRIVATE HEALTH INSURANCE | Source: Ambulatory Visit | Attending: Obstetrics and Gynecology | Admitting: Obstetrics and Gynecology

## 2010-11-15 DIAGNOSIS — R87613 High grade squamous intraepithelial lesion on cytologic smear of cervix (HGSIL): Secondary | ICD-10-CM

## 2010-11-15 DIAGNOSIS — N870 Mild cervical dysplasia: Secondary | ICD-10-CM | POA: Insufficient documentation

## 2010-11-15 DIAGNOSIS — Z01812 Encounter for preprocedural laboratory examination: Secondary | ICD-10-CM

## 2010-11-15 LAB — POCT PREGNANCY, URINE: Preg Test, Ur: NEGATIVE

## 2010-11-15 NOTE — Progress Notes (Signed)
Addended by: Catalina Antigua on: 11/15/2010 11:15 AM   Modules accepted: Orders

## 2010-11-15 NOTE — Progress Notes (Signed)
  Subjective:    Patient ID: Sherry [name], female    DOB: June 30, 1985, 25 y.o.   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  Patient given informed consent, signed copy in the chart, time out was performed.  Placed in lithotomy position. Cervix viewed with speculum and colposcope after application of acetic acid.   Colposcopy adequate?  yes Acetowhite lesions?yes 7 and 12 o'clock Punctation?no Mosaicism?  no Abnormal vasculature?  no Biopsies?yes ECC?yes  COMMENTS: Patient was given post procedure instructions.  She will return in 2 weeks for results.

## 2010-11-15 NOTE — Patient Instructions (Signed)
Colposcopy Care After Colposcopy is a procedure in which a special tool is used to magnify the surface of the cervix. A tissue sample (biopsy) may also be taken. This sample will be looked at for cervical cancer or other problems. After the test:  You may have some cramping.   Lie down for a few minutes if you feel lightheaded.    You may have some bleeding which should stop in a few days.  HOME CARE  Do not have sex or use tampons for 2 to 3 days or as told.   Only take medicine as told by your doctor.   Continue to take your birth control pills as usual.  Finding out the results of your test Ask when your test results will be ready. Make sure you get your test results. GET HELP RIGHT AWAY IF:  You are bleeding a lot or are passing blood clots.   You develop a fever of 102 F (38.9 C) or higher.   You have abnormal vaginal discharge.   You have cramps that do not go away with medicine.   You feel lightheaded, dizzy, or pass out (faint).  MAKE SURE YOU:   Understand these instructions.   Will watch your condition.   Will get help right away if you are not doing well or get worse.  Document Released: 06/18/2007 Document Revised: 09/11/2010 Document Reviewed: 06/18/2007 Exit[employer] Patient Information 2012 [employer]. 

## 2010-11-29 ENCOUNTER — Ambulatory Visit (INDEPENDENT_AMBULATORY_CARE_PROVIDER_SITE_OTHER): Payer: PRIVATE HEALTH INSURANCE | Admitting: Obstetrics and Gynecology

## 2010-11-29 VITALS — BP 119/75 | HR 99 | Temp 98.2°F | Ht 69.0 in | Wt 202.9 lb

## 2010-11-29 DIAGNOSIS — R87613 High grade squamous intraepithelial lesion on cytologic smear of cervix (HGSIL): Secondary | ICD-10-CM

## 2010-11-29 NOTE — Progress Notes (Signed)
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 Results were reviewed with the patient which showed neg ECC and LGSIL (CIN I) biopsy. Management options discussed with the patient. Patient was offered excisional biopsy or q6 months pap and colposcopy. Risk, benefits and alternatives of LEEP excisional biopsy was explained. All questions were answered. After watching LEEP video, patient opted for excisional biopsy. Patient will be scheduled for LEEP at next available appointment.

## 2010-11-29 NOTE — Patient Instructions (Signed)
Loop Electrosurgical Excision Procedure Loop electrosurgical excision procedure (LEEP) is the removal of a portion of the lower part of the uterus (cervix). The procedure is done when there are significantly abnormal cervical cell changes. Abnormal cell changes of the cervix can lead to cancer if left in place and untreated.  The LEEP procedure itself typically only takes a few minutes. Often, it may be done in your caregiver's office. The procedure is considered safe for those who wish to get pregnant or are trying to get pregnant. Only under rare circumstances should this procedure be done if you are pregnant. LET YOUR CAREGIVER KNOW ABOUT:  Whether you are pregnant or late for your last menstrual period.   Allergies to foods or medicines.   All the medicines you are taking includingherbs, eyedrops, and over-the-counter medicines, and creams.   Use of steroids (by mouth or creams).   Previous problems with anesthetics or numbing medicine.   Previous gynecological surgery.   History of blood clots or bleeding problems.   Any recent or current vaginal infections (herpes, sexually transmitted infections).   Other health problems.  RISKS AND COMPLICATIONS  Bleeding.   Infection.   Injury to the vagina, bladder, or rectum.   Very rare obstruction of the cervical opening that causes problems during menstruation (cervical stenosis).  BEFORE THE PROCEDURE  Do not take aspirin or blood thinners (anticoagulants) for 1 week before the procedure, or as told by your caregiver.   Eat a light meal before the procedure.   Ask your caregiver about changing or stopping your regular medicines.   You may be given a pain reliever 1 or 2 hours before the procedure.  PROCEDURE   A tool (speculum) is placed in the vagina. This allows your caregiver to see the cervix.   An iodine stain is applied to the cervix to find the area of abnormal cells to be removed.   Medicine is injected to numb  the cervix (local anesthetic).    Electricity is passed through a thin wire loop which is then used to remove (cauterize) a small segment of the affected cervix.   Light electrocautery is used to seal any small blood vessels and prevent bleeding.   A paste may be applied to the cauterized area of the cervix to help prevent bleeding.   The tissue sample is sent to the lab. It is examined under the microscope.  AFTER THE PROCEDURE  Have someone drive you home.   You may have slight to moderate cramping.   You may notice a black vaginal discharge from the paste used on the cervix to prevent bleeding. This is normal.   Watch for excessive bleeding. This requires immediate medical care.   Ask when your test results will be ready. Make sure you get your test results.  Document Released: 03/22/2002 Document Revised: 09/11/2010 Document Reviewed: 06/11/2010 Exit[employer] Patient Information 2012 [employer]. 

## 2010-12-13 ENCOUNTER — Other Ambulatory Visit (HOSPITAL_COMMUNITY)
Admission: RE | Admit: 2010-12-13 | Discharge: 2010-12-13 | Disposition: A | Discharge status: Home / Self Care | Payer: PRIVATE HEALTH INSURANCE | Source: Ambulatory Visit | Attending: Obstetrics and Gynecology | Admitting: Obstetrics and Gynecology

## 2010-12-13 ENCOUNTER — Ambulatory Visit (INDEPENDENT_AMBULATORY_CARE_PROVIDER_SITE_OTHER): Payer: PRIVATE HEALTH INSURANCE | Admitting: Obstetrics and Gynecology

## 2010-12-13 ENCOUNTER — Encounter: Payer: Self-pay | Admitting: Obstetrics & Gynecology

## 2010-12-13 VITALS — BP 118/75 | HR 93 | Temp 97.5°F | Ht 69.0 in | Wt 201.8 lb

## 2010-12-13 DIAGNOSIS — Z23 Encounter for immunization: Secondary | ICD-10-CM

## 2010-12-13 DIAGNOSIS — IMO0002 Reserved for concepts with insufficient information to code with codable children: Secondary | ICD-10-CM

## 2010-12-13 DIAGNOSIS — N870 Mild cervical dysplasia: Secondary | ICD-10-CM | POA: Insufficient documentation

## 2010-12-13 DIAGNOSIS — R8789 Other abnormal findings in specimens from female genital organs: Secondary | ICD-10-CM

## 2010-12-13 DIAGNOSIS — Z01812 Encounter for preprocedural laboratory examination: Secondary | ICD-10-CM

## 2010-12-13 DIAGNOSIS — B977 Papillomavirus as the cause of diseases classified elsewhere: Secondary | ICD-10-CM

## 2010-12-13 LAB — POCT PREGNANCY, URINE: Preg Test, Ur: NEGATIVE

## 2010-12-13 NOTE — Patient Instructions (Signed)
Loop Electrosurgical Excision Procedure Care After Refer to this sheet in the next few weeks. These instructions provide you with information on caring for yourself after your procedure. Your caregiver may also give you more specific instructions. Your treatment has been planned according to current medical practices, but problems sometimes occur. Call your caregiver if you have any problems or questions after your procedure. HOME CARE INSTRUCTIONS   Do not use tampons, douche, or have sexual intercourse for 2 weeks or as directed by your caregiver.   Begin normal activities if you have no or minimal cramping or bleeding, unless directed otherwise by your caregiver.   Take your temperature if you feel sick. Write down your temperature on paper, and tell your caregiver if you have a fever.   Take all medicines as directed by your caregiver.   Keep all your follow-up appointments and Pap tests as directed by your caregiver.  SEEK IMMEDIATE MEDICAL CARE IF:   You have bleeding that is heavier or longer than a normal menstrual cycle.   You have bleeding that is bright red.   You have blood clots.   You have a fever.   You have increasing cramps or pain not relieved by medicine.   You develop abdominal pain that does not seem to be related to the same area of earlier cramping and pain.   You are lightheaded, unusually weak, or faint.   You develop painful or bloody urination.   You develop a bad smelling vaginal discharge.  MAKE SURE YOU:  Understand these instructions.   Will watch your condition.   Will get help right away if you are not doing well or get worse.  Document Released: 09/12/2010 Document Reviewed: 06/20/2010 Exit[employer] Patient Information 2012 [employer]. 

## 2010-12-13 NOTE — Progress Notes (Signed)
Addended by: Sherre Lain A on: 12/13/2010 12:23 PM   Modules accepted: Orders

## 2010-12-13 NOTE — Progress Notes (Signed)
25 yo G2P1011 with HGSIl pap and negative colpo here for LEEP  Patient identified, informed consent obtained, signed copy in chart, time out performed.  Pap smear and colposcopy reviewed.   Pap HGSIL 09/2010 Colpo Biopsy LGSIL 11/2010 ECC neg 11/2010 Teflon coated speculum with smoke evacuator placed.  Cervix visualized. Paracervical block placed.  Medium-shallow size LOOP used to remove cone of cervix using blend of cut and cautery on LEEP machine.  Edges/Base cauterized with Ball.  Monsel's solution used for hemostasis.  Patient tolerated procedure well.  Patient given post procedure instructions.  Follow up in 4 months for repeat pap or as needed.

## 2011-01-10 ENCOUNTER — Ambulatory Visit (INDEPENDENT_AMBULATORY_CARE_PROVIDER_SITE_OTHER): Payer: PRIVATE HEALTH INSURANCE | Admitting: *Deleted

## 2011-01-10 DIAGNOSIS — R87613 High grade squamous intraepithelial lesion on cytologic smear of cervix (HGSIL): Secondary | ICD-10-CM

## 2011-01-10 DIAGNOSIS — Z23 Encounter for immunization: Secondary | ICD-10-CM

## 2011-01-10 MED ORDER — HPV QUADRIVALENT VACCINE IM SUSP
0.5000 mL | Freq: Once | INTRAMUSCULAR | Status: AC
  Administered 2011-01-10: 0.5 mL via INTRAMUSCULAR

## 2011-04-04 ENCOUNTER — Ambulatory Visit: Payer: PRIVATE HEALTH INSURANCE

## 2011-05-30 ENCOUNTER — Ambulatory Visit (INDEPENDENT_AMBULATORY_CARE_PROVIDER_SITE_OTHER): Payer: Self-pay | Admitting: *Deleted

## 2011-05-30 VITALS — BP 120/76 | HR 93 | Temp 97.8°F | Ht 69.0 in | Wt 194.3 lb

## 2011-05-30 DIAGNOSIS — Z23 Encounter for immunization: Secondary | ICD-10-CM

## 2011-05-30 DIAGNOSIS — Z7189 Other specified counseling: Secondary | ICD-10-CM

## 2012-04-12 ENCOUNTER — Telehealth: Payer: Self-pay | Admitting: *Deleted

## 2012-04-12 ENCOUNTER — Other Ambulatory Visit: Payer: Self-pay | Admitting: Obstetrics and Gynecology

## 2012-04-12 DIAGNOSIS — B009 Herpesviral infection, unspecified: Secondary | ICD-10-CM

## 2012-04-12 MED ORDER — VALACYCLOVIR HCL 500 MG PO TABS: 500.0000 mg | ORAL_TABLET | Freq: Two times a day (BID) | ORAL | Status: DC

## 2012-04-12 NOTE — Telephone Encounter (Signed)
Pt requested rx go to different pharmacy. Correct pharmacy put in and med reordered.

## 2012-04-12 NOTE — Progress Notes (Signed)
Patient called reporting active herpes outbreak and is requesting a refill on Valtrex. Rx e-prescribed to pharmacy on file. Patient advised to schedule an appointment if symptoms do not improve within 2-3 days.

## 2012-04-14 ENCOUNTER — Ambulatory Visit: Payer: Self-pay | Admitting: Obstetrics and Gynecology

## 2012-08-19 ENCOUNTER — Encounter: Payer: Self-pay | Admitting: Obstetrics & Gynecology

## 2012-08-19 ENCOUNTER — Ambulatory Visit (INDEPENDENT_AMBULATORY_CARE_PROVIDER_SITE_OTHER): Payer: Self-pay | Admitting: Obstetrics & Gynecology

## 2012-08-19 VITALS — BP 109/68 | HR 79 | Temp 98.5°F | Ht 68.0 in | Wt 172.8 lb

## 2012-08-19 DIAGNOSIS — N898 Other specified noninflammatory disorders of vagina: Secondary | ICD-10-CM

## 2012-08-19 DIAGNOSIS — L293 Anogenital pruritus, unspecified: Secondary | ICD-10-CM

## 2012-08-19 DIAGNOSIS — B9689 Other specified bacterial agents as the cause of diseases classified elsewhere: Secondary | ICD-10-CM

## 2012-08-19 DIAGNOSIS — A499 Bacterial infection, unspecified: Secondary | ICD-10-CM

## 2012-08-19 DIAGNOSIS — N760 Acute vaginitis: Secondary | ICD-10-CM

## 2012-08-19 NOTE — Progress Notes (Signed)
Subjective:     Patient ID: Sherry [name], female   DOB: 17-Dec-1985, 27 y.o.   MRN: [id number]  HPI Pt reports a h/o vaginal itching 2-3 days previously.  She denies sx currently.  She reports that she has recently been having intercourse more often and thinks her irritation might be related to that.  Pt denies being sexually active now x 1 week.  Due for cycle in 1 week. On no contraception.  Does not use condoms.  Was using the 'withdrawal' method.   Review of Systems     Objective:   Physical Exam BP 109/68  Pulse 79  Temp(Src) 98.5 F (36.9 C) (Oral)  Ht 5\' 8"  (1.727 m)  Wt 172 lb 12.8 oz (78.382 kg)  BMI 26.28 kg/m2  LMP 07/20/2012 GU: EGBUS: no lesions; no eryhema Vagina: no blood in vault; white discharge- not curdlike  Cervix: no lesion; no mucopurulent d/c       Assessment:     Vaginal itching- normal exam   Contraception counseling pt undecided    Plan:     Wet smear sent  Info given on contraception rec f/u as soon as she decides on an option  Monta Maiorana L. Harraway-Smith, M.D., [place] [place]

## 2012-08-19 NOTE — Patient Instructions (Signed)
Contraceptive Implant Information A contraceptive implant is a plastic rod that is inserted under the skin. It is usually inserted under the skin of your upper arm. It continually releases small amounts of progestin (synthetic progesterone) into the bloodstream. This prevents an egg from being released from the ovary. It also thickens the cervical mucus to prevent sperm from entering the cervix, and it thins the uterine lining to prevent a fertilized egg from attaching to the uterus. They can be effective for up to 3 years. Implants do not provide protection against sexually transmitted diseases (STDs).  The procedure to insert an implant usually takes about 10 minutes. There may be minor bruising, swelling, and discomfort at the insertion site for a couple days. The implant begins to work within the first day. Other contraceptive protection should be used for 2 weeks. Follow up with your caregiver to get rechecked as directed. Your caregiver will make sure you are a good candidate for the contraceptive implant. Discuss with your caregiver the possible side effects of the implant ADVANTAGES  It prevents pregnancy for up to 3 years.  It is easily reversible.  It is convenient.  The progestins may protect against uterine and ovarian cancer.  It can be used when breastfeeding.  It can be used by women who cannot take estrogen. DISADVANTAGES  You may have irregular or unplanned vaginal bleeding.  You may develop side effects, including headache, weight gain, acne, breast tenderness, or mood changes.  You may have tissue or nerve damage after insertion (rare).  It may be difficult and uncomfortable to remove.  Certain medications may interfere with the effectiveness of the implants. REMOVAL OF IMPLANT The implant should be removed in 3 years or as directed by your caregiver. The implants effect wears off in a few hours after removal. Your ability to get pregnant (fertility) is restored  within a couple of weeks. New implants can be inserted as soon as the old ones are removed if desired. DO NOT GET THE IMPLANT IF:   You are pregnant.  You have a history of breast cancer, osteoporosis, blood clots, heart disease, diabetes, high blood pressure, liver disease, tumors, or stroke.   You have undiagnosed vaginal bleeding.  You have overly sensitive to certain parts of the implant. Document Released: 12/19/2010 Document Revised: 03/24/2011 Document Reviewed: 12/19/2010 [facility] Patient Information 2014 [place], [place]. Contraception Choices Contraception (birth control) is the use of any methods or devices to prevent pregnancy. Below are some methods to help avoid pregnancy. HORMONAL METHODS   Contraceptive implant. This is a thin, plastic tube containing progesterone hormone. It does not contain estrogen hormone. Your caregiver inserts the tube in the inner part of the upper arm. The tube can remain in place for up to 3 years. After 3 years, the implant must be removed. The implant prevents the ovaries from releasing an egg (ovulation), thickens the cervical mucus which prevents sperm from entering the uterus, and thins the lining of the inside of the uterus.  Progesterone-only injections. These injections are given every 3 months by your caregiver to prevent pregnancy. This synthetic progesterone hormone stops the ovaries from releasing eggs. It also thickens cervical mucus and changes the uterine lining. This makes it harder for sperm to survive in the uterus.  Birth control pills. These pills contain estrogen and progesterone hormone. They work by stopping the egg from forming in the ovary (ovulation). Birth control pills are prescribed by a caregiver.Birth control pills can also be used to treat heavy  periods.  Minipill. This type of birth control pill contains only the progesterone hormone. They are taken every day of each month and must be prescribed by your  caregiver.  Birth control patch. The patch contains hormones similar to those in birth control pills. It must be changed once a week and is prescribed by a caregiver.  Vaginal ring. The ring contains hormones similar to those in birth control pills. It is left in the vagina for 3 weeks, removed for 1 week, and then a new one is put back in place. The patient must be comfortable inserting and removing the ring from the vagina.A caregiver's prescription is necessary.  Emergency contraception. Emergency contraceptives prevent pregnancy after unprotected sexual intercourse. This pill can be taken right after sex or up to 5 days after unprotected sex. It is most effective the sooner you take the pills after having sexual intercourse. Emergency contraceptive pills are available without a prescription. Check with your pharmacist. Do not use emergency contraception as your only form of birth control. BARRIER METHODS   Female condom. This is a thin sheath (latex or rubber) that is worn over the penis during sexual intercourse. It can be used with spermicide to increase effectiveness.  Female condom. This is a soft, loose-fitting sheath that is put into the vagina before sexual intercourse.  Diaphragm. This is a soft, latex, dome-shaped barrier that must be fitted by a caregiver. It is inserted into the vagina, along with a spermicidal jelly. It is inserted before intercourse. The diaphragm should be left in the vagina for 6 to 8 hours after intercourse.  Cervical cap. This is a round, soft, latex or plastic cup that fits over the cervix and must be fitted by a caregiver. The cap can be left in place for up to 48 hours after intercourse.  Sponge. This is a soft, circular piece of polyurethane foam. The sponge has spermicide in it. It is inserted into the vagina after wetting it and before sexual intercourse.  Spermicides. These are chemicals that kill or block sperm from entering the cervix and uterus. They  come in the form of creams, jellies, suppositories, foam, or tablets. They do not require a prescription. They are inserted into the vagina with an applicator before having sexual intercourse. The process must be repeated every time you have sexual intercourse. INTRAUTERINE CONTRACEPTION  Intrauterine device (IUD). This is a T-shaped device that is put in a woman's uterus during a menstrual period to prevent pregnancy. There are 2 types:  Copper IUD. This type of IUD is wrapped in copper wire and is placed inside the uterus. Copper makes the uterus and fallopian tubes produce a fluid that kills sperm. It can stay in place for 10 years.  Hormone IUD. This type of IUD contains the hormone progestin (synthetic progesterone). The hormone thickens the cervical mucus and prevents sperm from entering the uterus, and it also thins the uterine lining to prevent implantation of a fertilized egg. The hormone can weaken or kill the sperm that get into the uterus. It can stay in place for 5 years. PERMANENT METHODS OF CONTRACEPTION  Female tubal ligation. This is when the woman's fallopian tubes are surgically sealed, tied, or blocked to prevent the egg from traveling to the uterus.  Female sterilization. This is when the female has the tubes that carry sperm tied off (vasectomy).This blocks sperm from entering the vagina during sexual intercourse. After the procedure, the man can still ejaculate fluid (semen). NATURAL PLANNING METHODS  Natural family planning. This is not having sexual intercourse or using a barrier method (condom, diaphragm, cervical cap) on days the woman could become pregnant.  Calendar method. This is keeping track of the length of each menstrual cycle and identifying when you are fertile.  Ovulation method. This is avoiding sexual intercourse during ovulation.  Symptothermal method. This is avoiding sexual intercourse during ovulation, using a thermometer and ovulation  symptoms.  Post-ovulation method. This is timing sexual intercourse after you have ovulated. Regardless of which type or method of contraception you choose, it is important that you use condoms to protect against the transmission of sexually transmitted diseases (STDs). Talk with your caregiver about which form of contraception is most appropriate for you. Document Released: 12/30/2004 Document Revised: 03/24/2011 Document Reviewed: 05/08/2010 [facility] Patient Information 2014 [place], [place].

## 2012-08-20 MED ORDER — METRONIDAZOLE 500 MG PO TABS: 500.0000 mg | ORAL_TABLET | Freq: Two times a day (BID) | ORAL | Status: DC

## 2012-08-20 NOTE — Addendum Note (Signed)
Addended by: Willodean Rosenthal on: 08/20/2012 12:57 PM   Modules accepted: Orders

## 2013-01-21 ENCOUNTER — Ambulatory Visit (INDEPENDENT_AMBULATORY_CARE_PROVIDER_SITE_OTHER): Payer: Self-pay | Admitting: Obstetrics & Gynecology

## 2013-01-21 ENCOUNTER — Encounter: Payer: Self-pay | Admitting: Obstetrics & Gynecology

## 2013-01-21 VITALS — BP 118/68 | HR 76 | Temp 97.9°F | Ht 66.0 in | Wt 153.7 lb

## 2013-01-21 DIAGNOSIS — N898 Other specified noninflammatory disorders of vagina: Secondary | ICD-10-CM

## 2013-01-21 DIAGNOSIS — N390 Urinary tract infection, site not specified: Secondary | ICD-10-CM

## 2013-01-21 LAB — POCT URINALYSIS DIP (DEVICE)
Bilirubin Urine: NEGATIVE
Glucose, UA: NEGATIVE mg/dL
HGB URINE DIPSTICK: NEGATIVE
Ketones, ur: NEGATIVE mg/dL
Leukocytes, UA: NEGATIVE
NITRITE: NEGATIVE
PH: 6.5 (ref 5.0–8.0)
PROTEIN: NEGATIVE mg/dL
SPECIFIC GRAVITY, URINE: 1.02 (ref 1.005–1.030)
UROBILINOGEN UA: 0.2 mg/dL (ref 0.0–1.0)

## 2013-01-21 NOTE — Progress Notes (Signed)
   Subjective:    Patient ID: Sherry [name], female    DOB: 07/18/1985, 28 y.o.   MRN: [id number]  HPI 28 yo S AA P1 is here today because of a 2-3 day h/o vaginal discharge. She has not used any OTC meds for this. She has been in a monogamous relationship (she thinks) for the last 6 years with the FOB.   Review of Systems     Objective:   Physical Exam  Greenish, yellowish vaginal discharge, moderately large amount, No CMT      Assessment & Plan:  Vaginitis- await wet prep and cultures

## 2013-01-22 LAB — WET PREP, GENITAL
Clue Cells Wet Prep HPF POC: NONE SEEN
TRICH WET PREP: NONE SEEN

## 2013-01-24 LAB — GC/CHLAMYDIA PROBE AMP
CT Probe RNA: NEGATIVE
GC Probe RNA: NEGATIVE

## 2013-02-01 ENCOUNTER — Telehealth: Payer: Self-pay | Admitting: *Deleted

## 2013-02-01 ENCOUNTER — Telehealth: Payer: Self-pay

## 2013-02-01 DIAGNOSIS — B373 Candidiasis of vulva and vagina: Secondary | ICD-10-CM

## 2013-02-01 DIAGNOSIS — B3731 Acute candidiasis of vulva and vagina: Secondary | ICD-10-CM

## 2013-02-01 MED ORDER — FLUCONAZOLE 150 MG PO TABS: 150.0000 mg | ORAL_TABLET | Freq: Once | ORAL | Status: DC

## 2013-02-01 NOTE — Telephone Encounter (Signed)
Pt. Called front desk requesting results of wet prep done on 01/21/13, states she is still having some vaginal itching and irritation. Results showed some yeast. Diflucan 150mg  PO once prescribed to pt[facility]s [facility]. Pt. Notified.

## 2013-02-01 NOTE — Telephone Encounter (Signed)
Patient is requesting rx for a yeast infection.  Ok to call in per Dr. Marice Potterove.

## 2013-03-10 ENCOUNTER — Other Ambulatory Visit: Payer: Self-pay | Admitting: Obstetrics and Gynecology

## 2013-03-10 DIAGNOSIS — B373 Candidiasis of vulva and vagina: Secondary | ICD-10-CM

## 2013-03-10 DIAGNOSIS — B3731 Acute candidiasis of vulva and vagina: Secondary | ICD-10-CM

## 2013-03-10 MED ORDER — FLUCONAZOLE 150 MG PO TABS: 150.0000 mg | ORAL_TABLET | Freq: Once | ORAL | Status: DC

## 2013-03-16 ENCOUNTER — Ambulatory Visit (INDEPENDENT_AMBULATORY_CARE_PROVIDER_SITE_OTHER): Payer: Self-pay | Admitting: Obstetrics & Gynecology

## 2013-03-16 ENCOUNTER — Encounter: Payer: Self-pay | Admitting: Obstetrics & Gynecology

## 2013-03-16 VITALS — BP 110/71 | HR 92 | Temp 98.5°F | Ht 68.0 in | Wt 150.4 lb

## 2013-03-16 DIAGNOSIS — Z0000 Encounter for general adult medical examination without abnormal findings: Secondary | ICD-10-CM

## 2013-03-16 DIAGNOSIS — Z23 Encounter for immunization: Secondary | ICD-10-CM

## 2013-03-16 NOTE — Progress Notes (Signed)
   Subjective:    Patient ID: Sherry [name], female    DOB: 10/13/1985, 28 y.o.   MRN: [id number]  HPI  28 yo S AA lady here because of a 3 day h/o vaginal discharge. She has had a new sexual partner since her last GC/CT test 1/15. She has a h/o abnormal paps and no recent follow up.   Review of Systems     Objective:   Physical Exam  Whitish vaginal discharge, no odor Pap smear and wet prep sent     Assessment & Plan:  As above Flu vaccine today

## 2013-03-17 LAB — WET PREP, GENITAL
Trich, Wet Prep: NONE SEEN
Yeast Wet Prep HPF POC: NONE SEEN

## 2013-03-18 ENCOUNTER — Other Ambulatory Visit: Payer: Self-pay

## 2013-03-18 MED ORDER — METRONIDAZOLE 500 MG PO TABS: 500.0000 mg | ORAL_TABLET | Freq: Two times a day (BID) | ORAL | Status: DC

## 2013-03-18 NOTE — Telephone Encounter (Signed)
Pt's wet prep resulted in BV.  Pt called the front desk asking for the results.  Ordered Flagyl 500 mg according to protocol.

## 2013-11-14 ENCOUNTER — Encounter: Payer: Self-pay | Admitting: Obstetrics & Gynecology

## 2014-01-06 ENCOUNTER — Emergency Department (HOSPITAL_BASED_OUTPATIENT_CLINIC_OR_DEPARTMENT_OTHER)
Admission: EM | Admit: 2014-01-06 | Discharge: 2014-01-06 | Disposition: A | Discharge status: Home / Self Care | Payer: BC Managed Care – PPO | Attending: Emergency Medicine | Admitting: Emergency Medicine

## 2014-01-06 ENCOUNTER — Encounter (HOSPITAL_BASED_OUTPATIENT_CLINIC_OR_DEPARTMENT_OTHER): Payer: Self-pay | Admitting: *Deleted

## 2014-01-06 DIAGNOSIS — M791 Myalgia: Secondary | ICD-10-CM | POA: Insufficient documentation

## 2014-01-06 DIAGNOSIS — Z79899 Other long term (current) drug therapy: Secondary | ICD-10-CM | POA: Insufficient documentation

## 2014-01-06 DIAGNOSIS — Z87448 Personal history of other diseases of urinary system: Secondary | ICD-10-CM | POA: Diagnosis not present

## 2014-01-06 DIAGNOSIS — Z792 Long term (current) use of antibiotics: Secondary | ICD-10-CM | POA: Diagnosis not present

## 2014-01-06 DIAGNOSIS — B349 Viral infection, unspecified: Secondary | ICD-10-CM | POA: Diagnosis not present

## 2014-01-06 DIAGNOSIS — J029 Acute pharyngitis, unspecified: Secondary | ICD-10-CM | POA: Diagnosis present

## 2014-01-06 LAB — RAPID STREP SCREEN (MED CTR MEBANE ONLY): STREPTOCOCCUS, GROUP A SCREEN (DIRECT): NEGATIVE

## 2014-01-06 MED ORDER — IBUPROFEN 200 MG PO TABS
600.0000 mg | ORAL_TABLET | Freq: Once | ORAL | Status: AC
  Administered 2014-01-06: 600 mg via ORAL
  Filled 2014-01-06 (×2): qty 1

## 2014-01-06 NOTE — Discharge Instructions (Signed)
Continue taking motrin for pain.   Use mucinex or robitussin for cough.   Follow up with your doctor.   Return to ER if you have severe sore throat, fever, trouble breathing.

## 2014-01-06 NOTE — ED Notes (Signed)
Patient states she has had a headache, sore throat and cough for the last two days.

## 2014-01-06 NOTE — ED Provider Notes (Signed)
CSN: [id number]     Arrival date & time 01/06/14  1040 History   First MD Initiated Contact with Patient 01/06/14 1049     Chief Complaint  Patient presents with  . Sore Throat     (Consider location/radiation/quality/duration/timing/severity/associated sxs/prior Treatment) The history is provided by the patient.  Sherry [name] is a 28 y.o. female hx of herpes, here with sore throat, body aches, headaches. Symptoms for the last 2 days. She has a daughter with similar symptoms. Denies any fever or chills. Denies any trouble swallowing. Has some nonproductive cough as well. No nausea vomiting abdominal pain. Took motrin yesterday with good relief.    Past Medical History  Diagnosis Date  . Type 2 HSV infection of vulvovaginal region   . Mild cervical dysplasia   . Postpartum care following vaginal delivery 08/08/2010   Past Surgical History  Procedure Laterality Date  . Dilation and curettage, diagnostic / therapeutic     No family history on file. History  Substance Use Topics  . Smoking status: Never Smoker   . Smokeless tobacco: Never Used  . Alcohol Use: No   OB History    Gravida Para Term Preterm AB TAB SAB Ectopic Multiple Living   2 1 1  0 1 0 0 0 0 1     Review of Systems  HENT: Positive for sore throat.   Musculoskeletal: Positive for myalgias.  Neurological: Positive for headaches.  All other systems reviewed and are negative.     Allergies  Review of patient's allergies indicates no known allergies.  Home Medications   Prior to Admission medications   Medication Sig Start Date End Date Taking? Authorizing Provider  valACYclovir (VALTREX) 500 MG tablet Take 1 tablet (500 mg total) by mouth 2 (two) times daily. 04/12/12  Yes Peggy Constant, MD  metroNIDAZOLE (FLAGYL) 500 MG tablet Take 1 tablet (500 mg total) by mouth 2 (two) times daily. 03/18/13   Tereso NewcomerUgonna A Anyanwu, MD   BP 116/65 mmHg  Pulse 97  Temp(Src) 98.4 F (36.9 C) (Oral)  Resp 16  Ht 5\' 9"   (1.753 m)  Wt 155 lb (70.308 kg)  BMI 22.88 kg/m2  SpO2 98%  LMP 12/30/2013 (Exact Date) Physical Exam  Constitutional: She is oriented to person, place, and time. She appears well-developed and well-nourished.  HENT:  Head: Normocephalic.  Right Ear: External ear normal.  Left Ear: External ear normal.  Mouth/Throat: Oropharynx is clear and moist.  OP not red  Eyes: Conjunctivae and EOM are normal. Pupils are equal, round, and reactive to light.  Neck: Normal range of motion. Neck supple.  Cardiovascular: Normal rate, regular rhythm and normal heart sounds.   Pulmonary/Chest: Effort normal and breath sounds normal. No respiratory distress. She has no wheezes. She has no rales.  Abdominal: Soft. Bowel sounds are normal. She exhibits no distension. There is no tenderness. There is no rebound.  Musculoskeletal: Normal range of motion. She exhibits no edema or tenderness.  Neurological: She is alert and oriented to person, place, and time. No cranial nerve deficit. Coordination normal.  Skin: Skin is warm and dry.  Psychiatric: She has a normal mood and affect. Her behavior is normal. Judgment and thought content normal.  Nursing note and vitals reviewed.   ED Course  Procedures (including critical care time) Labs Review Labs Reviewed  RAPID STREP SCREEN    Imaging Review No results found.   EKG Interpretation None      MDM   Final diagnoses:  None    Sherry [name] is a 28 y.o. female here with myalgias, headaches, sore throat. Vitals stable. Well appearing. Likely viral syndrome. Will check rapid strep and give motrin.   11:39 AM Rapid strep neg. Will dc home.    Richardean Canalavid H Yao, MD 01/06/14 [phone]

## 2014-01-08 LAB — CULTURE, GROUP A STREP

## 2014-01-10 ENCOUNTER — Other Ambulatory Visit: Payer: Self-pay | Admitting: *Deleted

## 2014-01-10 ENCOUNTER — Other Ambulatory Visit: Payer: Self-pay | Admitting: Family Medicine

## 2014-01-10 MED ORDER — BENZONATATE 100 MG PO CAPS: 100.0000 mg | ORAL_CAPSULE | Freq: Three times a day (TID) | ORAL | Status: DC | PRN

## 2014-03-13 ENCOUNTER — Telehealth: Payer: Self-pay | Admitting: *Deleted

## 2014-03-13 MED ORDER — METRONIDAZOLE 500 MG PO TABS: 500.0000 mg | ORAL_TABLET | Freq: Two times a day (BID) | ORAL | Status: DC

## 2014-03-13 NOTE — Telephone Encounter (Signed)
Patient with complaints of bv symptoms. Rx sent in per protocol. Advised patient to followup if no improvement in symptoms.

## 2014-04-19 ENCOUNTER — Ambulatory Visit: Payer: Self-pay | Admitting: Obstetrics & Gynecology

## 2014-04-21 ENCOUNTER — Encounter: Payer: Self-pay | Admitting: Obstetrics & Gynecology

## 2014-04-21 ENCOUNTER — Ambulatory Visit (INDEPENDENT_AMBULATORY_CARE_PROVIDER_SITE_OTHER): Payer: BLUE CROSS/BLUE SHIELD | Admitting: Obstetrics & Gynecology

## 2014-04-21 VITALS — BP 112/52 | HR 71 | Temp 98.0°F | Ht 68.0 in | Wt 154.9 lb

## 2014-04-21 DIAGNOSIS — Z118 Encounter for screening for other infectious and parasitic diseases: Secondary | ICD-10-CM | POA: Diagnosis not present

## 2014-04-21 DIAGNOSIS — Z124 Encounter for screening for malignant neoplasm of cervix: Secondary | ICD-10-CM | POA: Diagnosis not present

## 2014-04-21 DIAGNOSIS — Z1151 Encounter for screening for human papillomavirus (HPV): Secondary | ICD-10-CM | POA: Diagnosis not present

## 2014-04-21 DIAGNOSIS — Z113 Encounter for screening for infections with a predominantly sexual mode of transmission: Secondary | ICD-10-CM | POA: Diagnosis not present

## 2014-04-21 DIAGNOSIS — Z0000 Encounter for general adult medical examination without abnormal findings: Secondary | ICD-10-CM

## 2014-04-21 DIAGNOSIS — Z01419 Encounter for gynecological examination (general) (routine) without abnormal findings: Secondary | ICD-10-CM

## 2014-04-21 LAB — WET PREP, GENITAL
Trich, Wet Prep: NONE SEEN
WBC, Wet Prep HPF POC: NONE SEEN

## 2014-04-21 NOTE — Progress Notes (Signed)
Subjective:    Sherry [name] is a 29 y.o. S AA P1  female who presents for an annual exam. The patient has no complaints today except for vaginal discharge that she believes is BV.  The patient is sexually active. GYN screening history: last pap: was normal. The patient wears seatbelts: yes. The patient participates in regular exercise: yes. Has the patient ever been transfused or tattooed?: no. The patient reports that there is not domestic violence in her life.   Menstrual History: OB History    Gravida Para Term Preterm AB TAB SAB Ectopic Multiple Living   2 1 1  0 1 0 0 0 0 1      Menarche age: [age]  Patient's last menstrual period was 03/26/2014 (exact date).    [name], current medications, past family history, past medical history, past social history, past surgical history and problem list.  Review of Systems Pertinent items are noted in HPI. She would like STI testing.   Objective:    BP 112/52 mmHg  Pulse 71  Temp(Src) 98 F (36.7 C) (Oral)  Ht 5\' 8"  (1.727 m)  Wt 154 lb 14.4 oz (70.262 kg)  BMI 23.56 kg/m2  LMP 03/26/2014 (Exact Date)  General Appearance:    Alert, cooperative, no distress, appears stated age  Head:    Normocephalic, without obvious abnormality, atraumatic  Eyes:    PERRL, conjunctiva/corneas clear, EOM's intact, fundi    benign, both eyes  Ears:    Normal TM's and external ear canals, both ears  Nose:   Nares normal, septum midline, mucosa normal, no drainage    or sinus tenderness  Throat:   Lips, mucosa, and tongue normal; teeth and gums normal  Neck:   Supple, symmetrical, trachea midline, no adenopathy;    thyroid:  no enlargement/tenderness/nodules; no carotid   bruit or JVD  Back:     Symmetric, no curvature, ROM normal, no CVA tenderness  Lungs:     Clear to auscultation bilaterally, respirations unlabored  Chest Wall:    No tenderness or deformity   Heart:    Regular rate and rhythm, S1 and S2 normal, no murmur, rub   or gallop  Breast Exam:    No tenderness, masses, or nipple abnormality  Abdomen:     Soft, non-tender, bowel sounds active all four quadrants,    no masses, no organomegaly  Genitalia:    Normal female without lesion, discharge or tenderness, ULN mobile, NT, normal adnexal exam     Extremities:   Extremities normal, atraumatic, no cyanosis or edema  Pulses:   2+ and symmetric all extremities  Skin:   Skin color, texture, turgor normal, no rashes or lesions  Lymph nodes:   Cervical, supraclavicular, and axillary nodes normal  Neurologic:   CNII-XII intact, normal strength, sensation and reflexes    throughout  .    Assessment:    Healthy female exam.    Plan:     Breast self exam technique reviewed and patient encouraged to perform self-exam monthly. Chlamydia specimen. GC specimen. Thin prep Pap smear. STI testing   Wet prep Treat with Hylafem for BV

## 2014-04-22 LAB — HEPATITIS B SURFACE ANTIGEN: HEP B S AG: NEGATIVE

## 2014-04-22 LAB — HEPATITIS C ANTIBODY: HCV Ab: NEGATIVE

## 2014-04-22 LAB — HIV ANTIBODY (ROUTINE TESTING W REFLEX): HIV 1&2 Ab, 4th Generation: NONREACTIVE

## 2014-04-22 LAB — RPR

## 2014-04-24 ENCOUNTER — Telehealth: Payer: Self-pay

## 2014-04-24 DIAGNOSIS — B373 Candidiasis of vulva and vagina: Secondary | ICD-10-CM

## 2014-04-24 DIAGNOSIS — B3731 Acute candidiasis of vulva and vagina: Secondary | ICD-10-CM

## 2014-04-24 DIAGNOSIS — B9689 Other specified bacterial agents as the cause of diseases classified elsewhere: Secondary | ICD-10-CM

## 2014-04-24 DIAGNOSIS — N760 Acute vaginitis: Secondary | ICD-10-CM

## 2014-04-24 LAB — CYTOLOGY - PAP

## 2014-04-24 MED ORDER — FLUCONAZOLE 150 MG PO TABS: 150.0000 mg | ORAL_TABLET | Freq: Once | ORAL | Status: DC

## 2014-04-24 MED ORDER — METRONIDAZOLE 500 MG PO TABS: 500.0000 mg | ORAL_TABLET | Freq: Two times a day (BID) | ORAL | Status: DC

## 2014-04-24 NOTE — Telephone Encounter (Signed)
-----   Message from Allie BossierMyra C Dove, MD sent at 04/24/2014  1:15 PM EDT ----- She has yeast on her pap smear, so please give her a script for diflucan. Thanks

## 2014-04-24 NOTE — Telephone Encounter (Signed)
Attempted to contact patient. No answer. Left message stating we are calling with results, please call clinic. Spoke to patient's mother, Sherry Ruffingntoinette, as noted in contacts that it is OK to speak to her, and informed her of RX at pharmacy. Patient to be informed. Flagyl also e-prescribed per protocol as RX given at visit was too expensive for patient.

## 2014-06-07 ENCOUNTER — Ambulatory Visit (INDEPENDENT_AMBULATORY_CARE_PROVIDER_SITE_OTHER): Payer: BLUE CROSS/BLUE SHIELD | Admitting: *Deleted

## 2014-06-07 DIAGNOSIS — Z111 Encounter for screening for respiratory tuberculosis: Secondary | ICD-10-CM | POA: Diagnosis not present

## 2014-06-07 MED ORDER — TUBERCULIN PPD 5 UNIT/0.1ML ID SOLN
5.0000 [IU] | Freq: Once | INTRADERMAL | Status: AC
  Administered 2014-06-07: 5 [IU] via INTRADERMAL

## 2014-06-09 ENCOUNTER — Ambulatory Visit: Payer: BLUE CROSS/BLUE SHIELD | Admitting: *Deleted

## 2014-06-09 ENCOUNTER — Encounter: Payer: Self-pay | Admitting: *Deleted

## 2014-06-09 DIAGNOSIS — Z111 Encounter for screening for respiratory tuberculosis: Secondary | ICD-10-CM

## 2014-06-09 NOTE — Progress Notes (Signed)
Pt presented for tb skin test reading, result is negative. Letter provided to patient.

## 2014-06-11 ENCOUNTER — Encounter (HOSPITAL_COMMUNITY): Payer: Self-pay | Admitting: Emergency Medicine

## 2014-06-11 ENCOUNTER — Emergency Department (HOSPITAL_COMMUNITY)
Admission: EM | Admit: 2014-06-11 | Discharge: 2014-06-11 | Disposition: A | Discharge status: Home / Self Care | Payer: BLUE CROSS/BLUE SHIELD | Attending: Emergency Medicine | Admitting: Emergency Medicine

## 2014-06-11 DIAGNOSIS — Z87448 Personal history of other diseases of urinary system: Secondary | ICD-10-CM | POA: Insufficient documentation

## 2014-06-11 DIAGNOSIS — J029 Acute pharyngitis, unspecified: Secondary | ICD-10-CM | POA: Insufficient documentation

## 2014-06-11 DIAGNOSIS — Z8619 Personal history of other infectious and parasitic diseases: Secondary | ICD-10-CM | POA: Insufficient documentation

## 2014-06-11 DIAGNOSIS — Z79899 Other long term (current) drug therapy: Secondary | ICD-10-CM | POA: Insufficient documentation

## 2014-06-11 LAB — RAPID STREP SCREEN (MED CTR MEBANE ONLY): Streptococcus, Group A Screen (Direct): NEGATIVE

## 2014-06-11 MED ORDER — PENICILLIN G BENZATHINE 1200000 UNIT/2ML IM SUSP
1.2000 10*6.[IU] | Freq: Once | INTRAMUSCULAR | Status: AC
  Administered 2014-06-11: 1.2 10*6.[IU] via INTRAMUSCULAR
  Filled 2014-06-11: qty 2

## 2014-06-11 NOTE — ED Notes (Addendum)
Pt c/o throat pain on right side of throat. Increased pain when swallowing. Pustules noted on both sides of throat. Denies fever

## 2014-06-11 NOTE — Discharge Instructions (Signed)
You have been treated for strep throat. May use chloraseptic and/or salt water gargles as needed for sore throat. Return here for new/worsening symptoms.

## 2014-06-11 NOTE — ED Provider Notes (Signed)
CSN: [id number]     Arrival date & time 06/11/14  1424 History  This chart was scribed for non-physician practitioner, Sharilyn Sites, PA-C working with Jerelyn Scott, MD by Freida Busman, ED Scribe. This patient was seen in room WTR2/WLPT2 and the patient's care was started at 2:42 PM.     Chief Complaint  Patient presents with  . Sore Throat    1 day hx of sore throat    The history is provided by the patient. No language interpreter was used.     HPI Comments:  Sherry [name] is a 29 y.o. female who presents to the Emergency Department complaining of constant sore throat that started 1 day ago. She reports associated pain with swallowing and notes white patches in the back of her throat. She notes recent sick contact with strep. Pt denies fever. No alleviating factors noted.  No intervention tried PTA.  VSS.   Past Medical History  Diagnosis Date  . Type 2 HSV infection of vulvovaginal region   . Mild cervical dysplasia   . Postpartum care following vaginal delivery 08/08/2010   Past Surgical History  Procedure Laterality Date  . Dilation and curettage, diagnostic / therapeutic     History reviewed. No pertinent family history. History  Substance Use Topics  . Smoking status: Never Smoker   . Smokeless tobacco: Never Used  . Alcohol Use: No   OB History    Gravida Para Term Preterm AB TAB SAB Ectopic Multiple Living   0 1 0 0 0 0 1     Review of Systems  Constitutional: Negative for fever.  HENT: Positive for sore throat.   All other systems reviewed and are negative.     Allergies  Review of patient's allergies indicates no known allergies.  Home Medications   Prior to Admission medications   Medication Sig Start Date End Date Taking? Authorizing Provider  benzonatate (TESSALON) 100 MG capsule Take 1 capsule (100 mg total) by mouth 3 (three) times daily as needed for cough. Patient not taking: Reported on 04/21/2014 01/10/14   Rhona Raider Stinson, DO   fluconazole (DIFLUCAN) 150 MG tablet Take 1 tablet (150 mg total) by mouth once. 04/24/14   Allie Bossier, MD  metroNIDAZOLE (FLAGYL) 500 MG tablet Take 1 tablet (500 mg total) by mouth 2 (two) times daily. Patient not taking: Reported on 04/21/2014 03/13/14   Catalina Antigua, MD  metroNIDAZOLE (FLAGYL) 500 MG tablet Take 1 tablet (500 mg total) by mouth 2 (two) times daily. 04/24/14   Allie Bossier, MD  valACYclovir (VALTREX) 500 MG tablet Take 1 tablet (500 mg total) by mouth 2 (two) times daily. 04/12/12   Peggy Constant, MD   BP 117/65 mmHg  Pulse 82  Temp(Src) 98.6 F (37 C) (Oral)  Resp 18  Wt 150 lb (68.04 kg)  SpO2 100%  LMP 05/26/2014   Physical Exam  Constitutional: She is oriented to person, place, and time. She appears well-developed and well-nourished.  HENT:  Head: Normocephalic and atraumatic.  Mouth/Throat: Uvula is midline and mucous membranes are normal. No oral lesions. No trismus in the jaw. Oropharyngeal exudate and posterior oropharyngeal erythema present. No posterior oropharyngeal edema or tonsillar abscesses.  Tonsils 1+ bilaterally with exudates present; uvula midline without evidence of peritonsillar abscess; handling secretions appropriately; no difficulty swallowing or speaking; normal phonation, no stridor  Eyes: Conjunctivae and EOM are normal. Pupils are equal, round, and reactive to light.  Neck: Normal range of motion.  Cardiovascular: Normal rate, regular rhythm and normal heart sounds.   Pulmonary/Chest: Effort normal and breath sounds normal.  Abdominal: Soft. Bowel sounds are normal.  Musculoskeletal: Normal range of motion.  Neurological: She is alert and oriented to person, place, and time.  Skin: Skin is warm and dry.  Psychiatric: She has a normal mood and affect.  Nursing note and vitals reviewed.   ED Course  Procedures   DIAGNOSTIC STUDIES:  Oxygen Saturation is 100% on RA, normal by my interpretation.    COORDINATION OF CARE:  2:43 PM  Will treat for strep; offered pt penicillin shot. Discussed treatment plan with pt at bedside and pt agreed to plan.  Labs Review Labs Reviewed - No data to display  Imaging Review No results found.   EKG Interpretation None      MDM   Final diagnoses:  Sore throat   29 year old female with sore throat. She has had recent sick contacts with known strep pharyngitis. Patient afebrile, nontoxic. Her tonsils are enlarged bilaterally with exudates present. Her uvula remains midline, no evidence of peritonsillar abscess at this time. She is handling secretions well, normal phonation, no stridor.  Rapid strep negative, culture pending.  I still have strong suspicion for step, will treat here with bicillin.  Discussed plan with patient, he/she acknowledged understanding and agreed with plan of care.  Return precautions given for new or worsening symptoms.  I personally performed the services described in this documentation, which was scribed in my presence. The recorded information has been reviewed and is accurate.  Garlon HatchetLisa M Naomi Fitton, PA-C 06/11/14 1557  Jerelyn ScottMartha Linker, MD 06/11/14 [phone]

## 2014-06-14 LAB — CULTURE, GROUP A STREP

## 2014-06-27 ENCOUNTER — Telehealth: Payer: Self-pay

## 2014-06-27 NOTE — Telephone Encounter (Addendum)
Pt wanted to know if she could get another Rx for her BV.  At pt's last appt Dr.Dove wanted to give pt samples from the [facility].  Called Dr. Marice Potter and was informed that [facility] does not have the samples anymore.  Dr. Marice Potter ordered new prescription boric acid capsules per vagina every night x 7 days with 6 refills and for pt to take otc probiotics.  Called [facility] and called in Rx for boric acid.  Pt notified of Rx at [facility] and to pick up otc probiotics.

## 2014-08-25 ENCOUNTER — Other Ambulatory Visit: Payer: Self-pay

## 2014-08-25 DIAGNOSIS — Z113 Encounter for screening for infections with a predominantly sexual mode of transmission: Secondary | ICD-10-CM

## 2014-08-25 LAB — RPR

## 2014-08-25 LAB — HEPATITIS B SURFACE ANTIGEN: HEP B S AG: NEGATIVE

## 2014-08-25 LAB — HEPATITIS C ANTIBODY: HCV AB: NEGATIVE

## 2014-08-26 LAB — GC/CHLAMYDIA PROBE AMP
CT PROBE, AMP APTIMA: NEGATIVE
GC PROBE AMP APTIMA: NEGATIVE

## 2014-08-26 LAB — HIV ANTIBODY (ROUTINE TESTING W REFLEX): HIV 1&2 Ab, 4th Generation: NONREACTIVE

## 2015-03-03 ENCOUNTER — Emergency Department (HOSPITAL_COMMUNITY)
Admission: EM | Admit: 2015-03-03 | Discharge: 2015-03-03 | Disposition: A | Discharge status: Home / Self Care | Payer: 59 | Attending: Emergency Medicine | Admitting: Emergency Medicine

## 2015-03-03 ENCOUNTER — Encounter (HOSPITAL_COMMUNITY): Payer: Self-pay | Admitting: Emergency Medicine

## 2015-03-03 DIAGNOSIS — Z87410 Personal history of cervical dysplasia: Secondary | ICD-10-CM | POA: Diagnosis not present

## 2015-03-03 DIAGNOSIS — J029 Acute pharyngitis, unspecified: Secondary | ICD-10-CM | POA: Diagnosis not present

## 2015-03-03 DIAGNOSIS — Z8619 Personal history of other infectious and parasitic diseases: Secondary | ICD-10-CM | POA: Insufficient documentation

## 2015-03-03 DIAGNOSIS — R110 Nausea: Secondary | ICD-10-CM | POA: Insufficient documentation

## 2015-03-03 LAB — RAPID STREP SCREEN (MED CTR MEBANE ONLY): Streptococcus, Group A Screen (Direct): NEGATIVE

## 2015-03-03 MED ORDER — AMOXICILLIN 500 MG PO CAPS: 500.0000 mg | ORAL_CAPSULE | Freq: Three times a day (TID) | ORAL | Status: DC

## 2015-03-03 NOTE — ED Notes (Signed)
Pt reports sore throat and body ache since last night. Also reports chills . No chest pain nor coughing.

## 2015-03-03 NOTE — ED Provider Notes (Signed)
CSN: [id number]     Arrival date & time 03/03/15  1603 History  By signing my name below, I, Sherry [name], attest that this documentation has been prepared under the direction and in the presence of non-physician practitioner, Jaynie Crumble, PA-C. Electronically Signed: Linna [name], Scribe. 03/03/2015. 4:35 PM.    Chief Complaint  Patient presents with  . Sore Throat    The history is provided by the patient. No language interpreter was used.     HPI Comments: Sherry [name] is a 30 y.o. female who presents to the Emergency Department complaining of sudden onset, worsening, sore throat beginning last night. Pt notes associated body aches, chills, pain with swallowing, and nausea as well. Pt states that she has been taking Mucinex and ibuprofen; she states that ibuprofen has helped her body aches but not her sore throat. Pt states that she experiences strep throat every couple of months. Pt has not attempted to gargle saltwater to alleviate sore throat.She denies allergies to antibiotics. She denies fever, cough, rhinorrhea, vomiting, chest pain, or any other associated symptoms at this time.     Past Medical History  Diagnosis Date  . Type 2 HSV infection of vulvovaginal region   . Mild cervical dysplasia   . Postpartum care following vaginal delivery 08/08/2010   Past Surgical History  Procedure Laterality Date  . Dilation and curettage, diagnostic / therapeutic     No family history on file. Social History  Substance Use Topics  . Smoking status: Never Smoker   . Smokeless tobacco: Never Used  . Alcohol Use: No   OB History    Gravida Para Term Preterm AB TAB SAB Ectopic Multiple Living   0 1 0 0 0 0 1     Review of Systems  Constitutional: Positive for chills. Negative for fever.  HENT: Positive for sore throat. Negative for rhinorrhea.   Respiratory: Negative for cough.   Cardiovascular: Negative for chest pain.  Gastrointestinal: Positive for nausea.  Negative for vomiting.  Musculoskeletal: Positive for myalgias.      Allergies  Review of patient's allergies indicates no known allergies.  Home Medications   Prior to Admission medications   Medication Sig Start Date End Date Taking? Authorizing Provider  acetaminophen (TYLENOL) 325 MG tablet Take 650 mg by mouth every 6 (six) hours as needed for moderate pain.    Historical Provider, MD  benzonatate (TESSALON) 100 MG capsule Take 1 capsule (100 mg total) by mouth 3 (three) times daily as needed for cough. Patient not taking: Reported on 04/21/2014 01/10/14   Rhona Raider Stinson, DO  fluconazole (DIFLUCAN) 150 MG tablet Take 1 tablet (150 mg total) by mouth once. Patient not taking: Reported on 06/11/2014 04/24/14   Allie Bossier, MD  ibuprofen (ADVIL,MOTRIN) 200 MG tablet Take 600 mg by mouth every 6 (six) hours as needed for headache or moderate pain.    Historical Provider, MD  metroNIDAZOLE (FLAGYL) 500 MG tablet Take 1 tablet (500 mg total) by mouth 2 (two) times daily. Patient not taking: Reported on 04/21/2014 03/13/14   Catalina Antigua, MD  metroNIDAZOLE (FLAGYL) 500 MG tablet Take 1 tablet (500 mg total) by mouth 2 (two) times daily. Patient not taking: Reported on 06/11/2014 04/24/14   Allie Bossier, MD  valACYclovir (VALTREX) 500 MG tablet Take 1 tablet (500 mg total) by mouth 2 (two) times daily. Patient not taking: Reported on 06/11/2014 04/12/12   Catalina Antigua, MD   BP 124/70 mmHg  Pulse 93  Temp(Src) 98.3 F (36.8 C) (Oral)  Resp 16  SpO2 100%  LMP 02/20/2015 Physical Exam  Constitutional: She is oriented to person, place, and time. She appears well-developed and well-nourished. No distress.  HENT:  Head: Normocephalic and atraumatic.  Right Ear: External ear normal.  Left Ear: External ear normal.  Nose: Nose normal.  Mouth/Throat: Uvula is midline and mucous membranes are normal. Oropharyngeal exudate, posterior oropharyngeal edema and posterior oropharyngeal erythema  present. No tonsillar abscesses.  Eyes: Conjunctivae and EOM are normal.  Neck: Neck supple. No tracheal deviation present.  Cardiovascular: Normal rate, regular rhythm and normal heart sounds.   Pulmonary/Chest: Effort normal and breath sounds normal. No respiratory distress. She has no wheezes. She has no rales.  Musculoskeletal: Normal range of motion.  Neurological: She is alert and oriented to person, place, and time.  Skin: Skin is warm and dry.  Psychiatric: She has a normal mood and affect. Her behavior is normal.  Nursing note and vitals reviewed.   ED Course  Procedures (including critical care time)  DIAGNOSTIC STUDIES: Oxygen Saturation is 100% on RA, normal by my interpretation.    COORDINATION OF CARE: 4:36 PM Discussed treatment plan with pt at bedside and pt agreed to plan.   Labs Review Labs Reviewed  RAPID STREP SCREEN (NOT AT [facility])    Imaging Review No results found.    EKG Interpretation None      MDM   Final diagnoses:  Pharyngitis   Patient in emergency department with sore throat and chills. Vital signs are normal here. Patient states she took ibuprofen prior to coming in. Patient has bilaterally enlarged tonsils with exudate. Uvula is midline and no evidence of peritonsillar abscess. She is able to swallow with no difficulty. Patient states she gets frequent strep and this feels the same. Although patient's rapid strep is negative, will treat with amoxicillin. Will have her follow-up if symptoms are worsening.  Filed Vitals:   03/03/15 1611  BP: 124/70  Pulse: 93  Temp: 98.3 F (36.8 C)  TempSrc: Oral  Resp: 16  SpO2: 100%    I personally performed the services described in this documentation, which was scribed in my presence. The recorded information has been reviewed and is accurate.   Jaynie Crumble, PA-C 03/03/15 1722  Raeford Razor, MD 03/03/15 Kristopher Oppenheim

## 2015-03-03 NOTE — Discharge Instructions (Signed)
Take amoxil as prescribed until all gone. Ibuprofen and tylenol for pain. Salt water gargles several times a day. chloraseptic spray for pain. Follow up as needed.    Pharyngitis Pharyngitis is redness, pain, and swelling (inflammation) of your pharynx.  CAUSES  Pharyngitis is usually caused by infection. Most of the time, these infections are from viruses (viral) and are part of a cold. However, sometimes pharyngitis is caused by bacteria (bacterial). Pharyngitis can also be caused by allergies. Viral pharyngitis may be spread from person to person by coughing, sneezing, and personal items or utensils (cups, forks, spoons, toothbrushes). Bacterial pharyngitis may be spread from person to person by more intimate contact, such as kissing.  SIGNS AND SYMPTOMS  Symptoms of pharyngitis include:   Sore throat.   Tiredness (fatigue).   Low-grade fever.   Headache.  Joint pain and muscle aches.  Skin rashes.  Swollen lymph nodes.  Plaque-like film on throat or tonsils (often seen with bacterial pharyngitis). DIAGNOSIS  Your health care provider will ask you questions about your illness and your symptoms. Your medical history, along with a physical exam, is often all that is needed to diagnose pharyngitis. Sometimes, a rapid strep test is done. Other lab tests may also be done, depending on the suspected cause.  TREATMENT  Viral pharyngitis will usually get better in 3-4 days without the use of medicine. Bacterial pharyngitis is treated with medicines that kill germs (antibiotics).  HOME CARE INSTRUCTIONS   Drink enough water and fluids to keep your urine clear or pale yellow.   Only take over-the-counter or prescription medicines as directed by your health care provider:   If you are prescribed antibiotics, make sure you finish them even if you start to feel better.   Do not take aspirin.   Get lots of rest.   Gargle with 8 oz of salt water ( tsp of salt per 1 qt of water)  as often as every 1-2 hours to soothe your throat.   Throat lozenges (if you are not at risk for choking) or sprays may be used to soothe your throat. SEEK MEDICAL CARE IF:   You have large, tender lumps in your neck.  You have a rash.  You cough up green, yellow-brown, or bloody spit. SEEK IMMEDIATE MEDICAL CARE IF:   Your neck becomes stiff.  You drool or are unable to swallow liquids.  You vomit or are unable to keep medicines or liquids down.  You have severe pain that does not go away with the use of recommended medicines.  You have trouble breathing (not caused by a stuffy nose). MAKE SURE YOU:   Understand these instructions.  Will watch your condition.  Will get help right away if you are not doing well or get worse.   This information is not intended to replace advice given to you by your health care provider. Make sure you discuss any questions you have with your health care provider.   Document Released: 12/30/2004 Document Revised: 10/20/2012 Document Reviewed: 09/06/2012 Elsevier Interactive Patient Education [employer]

## 2015-03-05 LAB — CULTURE, GROUP A STREP (THRC)

## 2015-08-06 ENCOUNTER — Ambulatory Visit: Payer: 59 | Admitting: *Deleted

## 2015-08-06 DIAGNOSIS — Z111 Encounter for screening for respiratory tuberculosis: Secondary | ICD-10-CM

## 2015-08-06 MED ORDER — TUBERCULIN PPD 5 UNIT/0.1ML ID SOLN
5.0000 [IU] | Freq: Once | INTRADERMAL | Status: AC
  Administered 2015-08-06: 5 [IU] via INTRADERMAL

## 2015-08-06 NOTE — Progress Notes (Signed)
Tuberculin 0.1 ml ID given to left forearm. Patient advised not to scratch or rub at site, return in 48 hours to have read. Understanding voiced.

## 2015-08-08 NOTE — Progress Notes (Signed)
Patient presented today to office to read her PPD. Patient reading was negative at this time.

## 2015-09-06 ENCOUNTER — Ambulatory Visit: Payer: Self-pay

## 2015-09-06 DIAGNOSIS — N390 Urinary tract infection, site not specified: Secondary | ICD-10-CM

## 2015-09-06 LAB — POCT URINALYSIS DIP (DEVICE)
BILIRUBIN URINE: NEGATIVE
GLUCOSE, UA: 100 mg/dL — AB
KETONES UR: NEGATIVE mg/dL
Nitrite: POSITIVE — AB
Protein, ur: 30 mg/dL — AB
SPECIFIC GRAVITY, URINE: 1.015 (ref 1.005–1.030)
Urobilinogen, UA: 2 mg/dL — ABNORMAL HIGH (ref 0.0–1.0)
pH: 6.5 (ref 5.0–8.0)

## 2015-09-06 MED ORDER — NITROFURANTOIN MONOHYD MACRO 100 MG PO CAPS: 100.0000 mg | ORAL_CAPSULE | Freq: Two times a day (BID) | ORAL | 0 refills | Status: DC

## 2015-09-06 NOTE — Progress Notes (Signed)
Patient presented today for urine check for uti. Patient stated she is having dysuria for a couple of days. She took AZO with no relief. Urine shows positive for nitrite and small of leuk. Patient is requesting Macrobid be called into the pharmacy [facility]. Urine will be sent for culture as well.

## 2015-09-09 LAB — URINE CULTURE

## 2015-10-05 ENCOUNTER — Ambulatory Visit: Payer: Self-pay | Admitting: *Deleted

## 2015-10-05 DIAGNOSIS — Z87440 Personal history of urinary (tract) infections: Secondary | ICD-10-CM

## 2015-10-05 DIAGNOSIS — N898 Other specified noninflammatory disorders of vagina: Secondary | ICD-10-CM

## 2015-10-05 LAB — POCT URINALYSIS DIP (DEVICE)
Glucose, UA: NEGATIVE mg/dL
Ketones, ur: 15 mg/dL — AB
LEUKOCYTES UA: NEGATIVE
Nitrite: NEGATIVE
SPECIFIC GRAVITY, URINE: 1.02 (ref 1.005–1.030)
UROBILINOGEN UA: 2 mg/dL — AB (ref 0.0–1.0)
pH: 5.5 (ref 5.0–8.0)

## 2015-10-05 MED ORDER — METRONIDAZOLE 500 MG PO TABS: 500.0000 mg | ORAL_TABLET | Freq: Two times a day (BID) | ORAL | 0 refills | Status: DC

## 2015-10-05 NOTE — Progress Notes (Signed)
Sherry [name] c/o bad odor - thinks she has a UTI- states she was recently treated for UTI and wants to check her urine. Denies pain or burning with urination .  Also states she has had BV before and wants treatment for that. Will send in rx for BV. Will run UA. Ua negative ( is on cycle).

## 2015-10-10 ENCOUNTER — Ambulatory Visit (INDEPENDENT_AMBULATORY_CARE_PROVIDER_SITE_OTHER): Payer: 59 | Admitting: Family

## 2015-10-10 ENCOUNTER — Encounter: Payer: Self-pay | Admitting: Family

## 2015-10-10 VITALS — BP 116/65 | HR 67 | Ht 69.0 in | Wt 170.4 lb

## 2015-10-10 DIAGNOSIS — N898 Other specified noninflammatory disorders of vagina: Secondary | ICD-10-CM

## 2015-10-10 DIAGNOSIS — N9489 Other specified conditions associated with female genital organs and menstrual cycle: Secondary | ICD-10-CM | POA: Diagnosis not present

## 2015-10-10 NOTE — Patient Instructions (Signed)
Over the Counter Vaginal Probiotics

## 2015-10-10 NOTE — Progress Notes (Signed)
Here for vaginal discharge/odor.

## 2015-10-10 NOTE — Progress Notes (Signed)
Subjective:    Sherry [name] is a 30 y.o. female who presents for vaginal discharge with an odor x 2 days. Sexual history reviewed with the patient. STI Exposure: denies knowledge of risky exposure.  Current symptoms vaginal discharge: white.  Menstrual History: OB History    Gravida Para Term Preterm AB Living   2 1 1  0 1 1   SAB TAB Ectopic Multiple Live Births   0 0 0 0 1      Patient's last menstrual period was 10/02/2015.    [name], current medications, past family history, past medical history, past social history, past surgical history and problem list.  Review of Systems Pertinent items are noted in HPI.    Objective:    BP 116/65   Pulse 67   Ht 5\' 9"  (1.753 m)   Wt 170 lb 6.4 oz (77.3 kg)   LMP 10/02/2015   BMI 25.16 kg/m  General:   alert, cooperative and appears stated age  Lymph Nodes:   Cervical, supraclavicular, and axillary nodes normal.  Pelvis:  External genitalia: normal general appearance  Cultures:  GC and Chlamydia genprobes and bacterial culture     Assessment:    Very low risk of STD exposure   Vaginal discharge/odor   Plan:    See orders.   Advised vaginal probiotics GC/CT pending Wet prep pending  Marlis EdelsonWalidah N Karim, CNM

## 2015-10-11 LAB — GC/CHLAMYDIA PROBE AMP (~~LOC~~) NOT AT ARMC
Chlamydia: NEGATIVE
Neisseria Gonorrhea: NEGATIVE

## 2015-10-11 LAB — WET PREP, GENITAL
TRICH WET PREP: NONE SEEN
WBC, Wet Prep HPF POC: NONE SEEN
YEAST WET PREP: NONE SEEN

## 2015-10-16 ENCOUNTER — Telehealth: Payer: Self-pay

## 2015-10-16 ENCOUNTER — Other Ambulatory Visit: Payer: Self-pay | Admitting: *Deleted

## 2015-10-16 ENCOUNTER — Telehealth: Payer: Self-pay | Admitting: Family

## 2015-10-16 DIAGNOSIS — B9689 Other specified bacterial agents as the cause of diseases classified elsewhere: Secondary | ICD-10-CM

## 2015-10-16 DIAGNOSIS — N760 Acute vaginitis: Principal | ICD-10-CM

## 2015-10-16 MED ORDER — METRONIDAZOLE 0.75 % VA GEL: 1.0000 | Freq: Two times a day (BID) | VAGINAL | 0 refills | Status: AC

## 2015-10-16 MED ORDER — METRONIDAZOLE 500 MG PO TABS
500.0000 mg | ORAL_TABLET | Freq: Two times a day (BID) | ORAL | 0 refills | Status: DC
Start: 2015-10-16 — End: 2016-06-26

## 2015-10-16 NOTE — Telephone Encounter (Signed)
I have spoken with patient. She stated the Flagyl did not work for her the last time she used it. Do you want to try something else?

## 2015-10-16 NOTE — Progress Notes (Signed)
Pt was prescribed flagyl for BV but would prefer to try metrogel this time. Rx to pharmacy.

## 2015-10-16 NOTE — Telephone Encounter (Signed)
Patient informed of test results

## 2015-10-16 NOTE — Telephone Encounter (Signed)
Left message to call office for results - BV.

## 2015-10-17 MED ORDER — CLINDAMYCIN HCL 300 MG PO CAPS: 300.0000 mg | ORAL_CAPSULE | Freq: Two times a day (BID) | ORAL | 0 refills | Status: DC

## 2015-10-17 NOTE — Addendum Note (Signed)
Addended by: Marlis EdelsonKARIM, Chuckie Mccathern N on: 10/17/2015 08:17 AM   Modules accepted: Orders

## 2015-12-06 ENCOUNTER — Emergency Department (HOSPITAL_COMMUNITY)
Admission: EM | Admit: 2015-12-06 | Discharge: 2015-12-07 | Disposition: A | Discharge status: Home / Self Care | Payer: 59 | Attending: Emergency Medicine | Admitting: Emergency Medicine

## 2015-12-06 ENCOUNTER — Encounter (HOSPITAL_COMMUNITY): Payer: Self-pay

## 2015-12-06 DIAGNOSIS — J028 Acute pharyngitis due to other specified organisms: Secondary | ICD-10-CM | POA: Diagnosis not present

## 2015-12-06 DIAGNOSIS — J029 Acute pharyngitis, unspecified: Secondary | ICD-10-CM | POA: Diagnosis not present

## 2015-12-06 DIAGNOSIS — B9689 Other specified bacterial agents as the cause of diseases classified elsewhere: Secondary | ICD-10-CM | POA: Insufficient documentation

## 2015-12-06 LAB — RAPID STREP SCREEN (MED CTR MEBANE ONLY): STREPTOCOCCUS, GROUP A SCREEN (DIRECT): NEGATIVE

## 2015-12-06 NOTE — ED Provider Notes (Signed)
[facility]-EMERGENCY DEPT Provider Note   CSN: [id number] Arrival date & time: 12/06/15  2316   By signing my name below, I, Clovis PuAvnee Patel, attest that this documentation has been prepared under the direction and in the presence of  Kerrie BuffaloHope Neese, NP. Electronically Signed: Clovis PuAvnee Patel, ED Scribe. 12/06/15. 11:53 PM.   History   Chief Complaint Chief Complaint  Patient presents with  . Sore Throat     The history is provided by the patient. No language interpreter was used.  Sore Throat  This is a new problem. The current episode started more than 2 days ago. The problem occurs constantly. The problem has been gradually worsening. Pertinent negatives include no abdominal pain. Nothing aggravates the symptoms. Nothing relieves the symptoms. Treatments tried: bayer. The treatment provided mild relief.   HPI Comments:  Sherry [name] is a 30 y.o. female who presents to the Emergency Department complaining of sudden onset, gradually worsening sore throat x several days. She notes associated chills. Pt states she gets strep throat once a year. She has taken bayer and mucinex with little relief. Pt denies fevers, nausea, vomiting, diarrhea, abdominal pain, cough, congestion, dysuria, hematuria, any other associated symptoms and modifying factors at this time.   Past Medical History:  Diagnosis Date  . Mild cervical dysplasia   . Postpartum care following vaginal delivery 08/08/2010  . Type 2 HSV infection of vulvovaginal region     Patient Active Problem List   Diagnosis Date Noted  . 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 Postpartum care following vaginal delivery 08/08/2010    Past Surgical History:  Procedure Laterality Date  . DILATION AND CURETTAGE, DIAGNOSTIC / THERAPEUTIC      OB History    Gravida Para Term Preterm AB Living   2 1 1  0 1 1   SAB TAB Ectopic Multiple Live Births   0 0 0 0 1       Home Medications     Prior to Admission medications   Medication Sig Start Date End Date Taking? Authorizing Provider  acetaminophen (TYLENOL) 325 MG tablet Take 650 mg by mouth every 6 (six) hours as needed for moderate pain.    Historical Provider, MD  benzocaine-menthol (CHLORASEPTIC) 6-10 MG lozenge Take 1 lozenge by mouth as needed for sore throat. 12/07/15   Hope Orlene OchM Neese, NP  clindamycin (CLEOCIN) 300 MG capsule Take 1 capsule (300 mg total) by mouth 2 (two) times daily. 10/17/15   Marlis EdelsonWalidah N Karim, CNM  ibuprofen (ADVIL,MOTRIN) 600 MG tablet Take 1 tablet (600 mg total) by mouth every 6 (six) hours as needed. 12/07/15   Hope Orlene OchM Neese, NP  metroNIDAZOLE (FLAGYL) 500 MG tablet Take 1 tablet (500 mg total) by mouth 2 (two) times daily. 10/05/15   Lakeside Bingharlie Pickens, MD  metroNIDAZOLE (FLAGYL) 500 MG tablet Take 1 tablet (500 mg total) by mouth 2 (two) times daily. 10/16/15   Marlis EdelsonWalidah N Karim, CNM  penicillin v potassium (VEETID) 500 MG tablet Take 1 tablet (500 mg total) by mouth 3 (three) times daily. 12/07/15   Hope Orlene OchM Neese, NP  valACYclovir (VALTREX) 500 MG tablet Take 1 tablet (500 mg total) by mouth 2 (two) times daily. Patient not taking: Reported on 10/10/2015 04/12/12   Catalina AntiguaPeggy Constant, MD    Family History No family history on file.  Social History Social History  Substance Use Topics  . Smoking status: Never Smoker  . Smokeless tobacco: Never Used  . Alcohol  use Yes     Comment: social      Allergies   Patient has no known allergies.   Review of Systems Review of Systems  Constitutional: Positive for chills. Negative for fever.  HENT: Positive for dental problem and sore throat. Negative for congestion.   Respiratory: Negative for cough.   Gastrointestinal: Negative for abdominal pain, diarrhea, nausea and vomiting.  Genitourinary: Negative for dysuria and hematuria.     Physical Exam Updated Vital Signs BP 114/59 (BP Location: Right Arm)   Pulse 98   Temp 99.2 F (37.3 C) (Oral)    Resp 18   LMP 11/28/2015   SpO2 100%   Physical Exam  Constitutional: She is oriented to person, place, and time. She appears well-developed and well-nourished. No distress.  HENT:  Head: Normocephalic and atraumatic.  Right Ear: Tympanic membrane normal.  Left Ear: Tympanic membrane normal.  Mouth/Throat: Uvula is midline. Posterior oropharyngeal erythema present. Tonsillar exudate.  Enlarged tonsils with exudate  Eyes: Conjunctivae are normal.  Neck: Normal range of motion.  No meningeal signs  Cardiovascular: Normal rate and regular rhythm.   Pulmonary/Chest: Effort normal and breath sounds normal.  Abdominal: Soft. She exhibits no distension. There is no tenderness.  Lymphadenopathy:    She has cervical adenopathy.  Neurological: She is alert and oriented to person, place, and time.  Skin: Skin is warm and dry.  Psychiatric: She has a normal mood and affect.  Nursing note and vitals reviewed.   ED Treatments / Results  DIAGNOSTIC STUDIES:  Oxygen Saturation is 100% on RA, normal by my interpretation.    COORDINATION OF CARE:  11:50 PM Discussed treatment plan with pt at bedside and pt agreed to plan.  Labs (all labs ordered are listed, but only abnormal results are displayed) Labs Reviewed  RAPID STREP SCREEN (NOT AT [facility])  CULTURE, GROUP A STREP [facility]    Radiology No results found.  Procedures Procedures (including critical care time)  Medications Ordered in ED Medications  penicillin v potassium (VEETID) tablet 500 mg (not administered)     Initial Impression / Assessment and Plan / ED Course  I have reviewed the triage vital signs and the nursing notes.  Pertinent lab results that were available during my care of the patient were reviewed by me and considered in my medical decision making (see chart for details).  Clinical Course     Patient with tonsillar exudate. Pt is tolerating secretions. Presentation not concerning for peritonsillar abscess  or spread of infection to deep spaces of the throat; patent airway. Pt will be discharged with Penicillin.  Specific return precautions discussed. Recommended PCP follow up. Pt appears safe for discharge.    Final Clinical Impressions(s) / ED Diagnoses   Final diagnoses:  Pharyngitis due to other organism    New Prescriptions New Prescriptions   BENZOCAINE-MENTHOL (CHLORASEPTIC) 6-10 MG LOZENGE    Take 1 lozenge by mouth as needed for sore throat.   IBUPROFEN (ADVIL,MOTRIN) 600 MG TABLET    Take 1 tablet (600 mg total) by mouth every 6 (six) hours as needed.   PENICILLIN V POTASSIUM (VEETID) 500 MG TABLET    Take 1 tablet (500 mg total) by mouth 3 (three) times daily.   I personally performed the services described in this documentation, which was scribed in my presence. The recorded information has been reviewed and is accurate.     [id number]Hope BrownstownM Neese, NP 12/07/15 [id number]    Glynn OctaveStephen Rancour, MD 12/07/15 [phone]

## 2015-12-06 NOTE — ED Triage Notes (Signed)
Pt states 2 days ago she started having body aches with sore throat; pt states she believes she has strep throat; Pt a&ox 4 on arrival; Pt states pain in throat is 10/10;

## 2015-12-07 DIAGNOSIS — J028 Acute pharyngitis due to other specified organisms: Secondary | ICD-10-CM | POA: Diagnosis not present

## 2015-12-07 DIAGNOSIS — J029 Acute pharyngitis, unspecified: Secondary | ICD-10-CM | POA: Diagnosis not present

## 2015-12-07 DIAGNOSIS — B9689 Other specified bacterial agents as the cause of diseases classified elsewhere: Secondary | ICD-10-CM | POA: Diagnosis not present

## 2015-12-07 MED ORDER — BENZOCAINE-MENTHOL 6-10 MG MT LOZG: 1.0000 | LOZENGE | OROMUCOSAL | 0 refills | Status: DC | PRN

## 2015-12-07 MED ORDER — PENICILLIN V POTASSIUM 250 MG PO TABS
500.0000 mg | ORAL_TABLET | Freq: Once | ORAL | Status: AC
  Administered 2015-12-07: 500 mg via ORAL
  Filled 2015-12-07: qty 2

## 2015-12-07 MED ORDER — PENICILLIN V POTASSIUM 500 MG PO TABS: 500.0000 mg | ORAL_TABLET | Freq: Three times a day (TID) | ORAL | 0 refills | Status: DC

## 2015-12-07 MED ORDER — IBUPROFEN 600 MG PO TABS: 600.0000 mg | ORAL_TABLET | Freq: Four times a day (QID) | ORAL | 0 refills | Status: DC | PRN

## 2015-12-07 MED ORDER — DEXAMETHASONE 4 MG PO TABS
10.0000 mg | ORAL_TABLET | Freq: Once | ORAL | Status: AC
  Administered 2015-12-07: 10 mg via ORAL
  Filled 2015-12-07: qty 3

## 2015-12-07 NOTE — ED Notes (Signed)
Pt understood dc material. NAD noted. Scripts given at dc 

## 2015-12-09 LAB — CULTURE, GROUP A STREP (THRC)

## 2016-06-26 ENCOUNTER — Other Ambulatory Visit: Payer: Self-pay | Admitting: Obstetrics & Gynecology

## 2016-06-26 DIAGNOSIS — N760 Acute vaginitis: Principal | ICD-10-CM

## 2016-06-26 DIAGNOSIS — B9689 Other specified bacterial agents as the cause of diseases classified elsewhere: Secondary | ICD-10-CM | POA: Insufficient documentation

## 2016-06-26 DIAGNOSIS — N898 Other specified noninflammatory disorders of vagina: Secondary | ICD-10-CM

## 2016-06-26 MED ORDER — METRONIDAZOLE 500 MG PO TABS: 500.0000 mg | ORAL_TABLET | Freq: Two times a day (BID) | ORAL | 7 refills | Status: DC

## 2016-08-26 ENCOUNTER — Other Ambulatory Visit: Payer: Self-pay | Admitting: Student

## 2016-08-26 ENCOUNTER — Telehealth: Payer: Self-pay | Admitting: *Deleted

## 2016-08-26 DIAGNOSIS — N898 Other specified noninflammatory disorders of vagina: Secondary | ICD-10-CM

## 2016-08-26 MED ORDER — SECNIDAZOLE 2 G PO PACK: 1.0000 | PACK | Freq: Once | ORAL | 0 refills | Status: AC

## 2016-08-26 MED ORDER — METRONIDAZOLE 500 MG PO TABS: 500.0000 mg | ORAL_TABLET | Freq: Two times a day (BID) | ORAL | 0 refills | Status: DC

## 2016-08-26 NOTE — Telephone Encounter (Signed)
Pt has recurrent bv. Rx to pharmacy for flagyl. Advised patient that she will need to be seen prior to getting future refills.

## 2016-09-12 ENCOUNTER — Ambulatory Visit: Payer: 59 | Admitting: Obstetrics & Gynecology

## 2016-10-03 ENCOUNTER — Encounter: Payer: Self-pay | Admitting: Gastroenterology

## 2016-10-06 ENCOUNTER — Telehealth: Payer: Self-pay | Admitting: *Deleted

## 2016-10-06 DIAGNOSIS — K625 Hemorrhage of anus and rectum: Secondary | ICD-10-CM

## 2016-10-06 NOTE — Telephone Encounter (Signed)
Per Dr. Marice Potter patient may have referral for gi due to rectal bleeding. Referral entered in epic.

## 2016-10-09 DIAGNOSIS — Z800 Family history of malignant neoplasm of digestive organs: Secondary | ICD-10-CM | POA: Diagnosis not present

## 2016-10-09 DIAGNOSIS — Z1211 Encounter for screening for malignant neoplasm of colon: Secondary | ICD-10-CM | POA: Diagnosis not present

## 2016-10-09 DIAGNOSIS — K602 Anal fissure, unspecified: Secondary | ICD-10-CM | POA: Diagnosis not present

## 2016-10-09 DIAGNOSIS — K625 Hemorrhage of anus and rectum: Secondary | ICD-10-CM | POA: Diagnosis not present

## 2016-11-06 ENCOUNTER — Encounter (HOSPITAL_COMMUNITY): Payer: Self-pay | Admitting: Family Medicine

## 2016-11-06 ENCOUNTER — Ambulatory Visit (HOSPITAL_COMMUNITY)
Admission: EM | Admit: 2016-11-06 | Discharge: 2016-11-06 | Disposition: A | Discharge status: Home / Self Care | Payer: 59 | Attending: Family Medicine | Admitting: Family Medicine

## 2016-11-06 DIAGNOSIS — L739 Follicular disorder, unspecified: Secondary | ICD-10-CM

## 2016-11-06 MED ORDER — SULFAMETHOXAZOLE-TRIMETHOPRIM 800-160 MG PO TABS: 1.0000 | ORAL_TABLET | Freq: Two times a day (BID) | ORAL | 0 refills | Status: DC

## 2016-11-06 NOTE — ED Triage Notes (Signed)
Pt here for abscess to LLE that has been draining. Reports possible MRSA.

## 2016-11-06 NOTE — ED Provider Notes (Signed)
  [facility]   [id number] 11/06/16 Arrival Time: 1148  ASSESSMENT & PLAN:  1. Folliculitis     Meds ordered this encounter  Medications  . sulfamethoxazole-trimethoprim (BACTRIM DS,SEPTRA DS) 800-160 MG tablet    Sig: Take 1 tablet by mouth 2 (two) times daily.    Dispense:  14 tablet    Refill:  0   Will watch for a couple of days and start antibiotic if she is not seeing improvement. May f/u as needed. Reviewed expectations re: course of current medical issues. Questions answered. Outlined signs and symptoms indicating need for more acute intervention. Patient verbalized understanding. After Visit Summary given.   SUBJECTIVE:  Sherry [name] is a 31 y.o. female who presents with complaint of "small bump" on her L lower leg below the knee. Present for several days. Yesterday "drained pus." No pain. Afebrile. No h/o recurrent skin infections. No OTC treatment.  ROS: As per HPI.  OBJECTIVE: Vitals:   11/06/16 1204  BP: 122/83  Pulse: 85  Resp: 18  Temp: 98.1 F (36.7 C)  SpO2: 100%    General appearance: alert; no distress Extremities: no edema Skin: warm and dry; LLE below knee with approx 1cm very slight induration that has drained; non-tender; no active drainage or bleeding Psychological: alert and cooperative; normal mood and affect   No Known Allergies  Past Medical History:  Diagnosis Date  . Mild cervical dysplasia   . Postpartum care following vaginal delivery 08/08/2010  . Type 2 HSV infection of vulvovaginal region    Social History   Social History  . Marital status: Single    Spouse name: N/A  . Number of children: N/A  . Years of education: N/A   Occupational History  . Not on file.   Social History Main Topics  . Smoking status: Never Smoker  . Smokeless tobacco: Never Used  . Alcohol use Yes     Comment: social   . Drug use: No  . Sexual activity: Yes    Birth control/ protection: Condom   Other Topics Concern  . Not on  file   Social History Narrative  . No narrative on file   History reviewed. No pertinent family history. Past Surgical History:  Procedure Laterality Date  . DILATION AND CURETTAGE, DIAGNOSTIC / THERAPEUTIC       Mardella LaymanHagler, Gwenyth Dingee, MD 11/06/16 1218

## 2016-11-07 ENCOUNTER — Ambulatory Visit: Payer: Self-pay | Admitting: Family Medicine

## 2016-11-25 ENCOUNTER — Ambulatory Visit: Payer: 59 | Admitting: Gastroenterology

## 2017-01-19 ENCOUNTER — Ambulatory Visit (INDEPENDENT_AMBULATORY_CARE_PROVIDER_SITE_OTHER): Payer: 59 | Admitting: Family Medicine

## 2017-01-19 ENCOUNTER — Encounter: Payer: Self-pay | Admitting: Family Medicine

## 2017-01-19 VITALS — BP 106/71 | HR 68 | Ht 69.0 in | Wt 189.6 lb

## 2017-01-19 DIAGNOSIS — Z01419 Encounter for gynecological examination (general) (routine) without abnormal findings: Secondary | ICD-10-CM

## 2017-01-19 DIAGNOSIS — N898 Other specified noninflammatory disorders of vagina: Secondary | ICD-10-CM

## 2017-01-19 DIAGNOSIS — Z124 Encounter for screening for malignant neoplasm of cervix: Secondary | ICD-10-CM | POA: Diagnosis not present

## 2017-01-19 DIAGNOSIS — Z113 Encounter for screening for infections with a predominantly sexual mode of transmission: Secondary | ICD-10-CM

## 2017-01-19 DIAGNOSIS — Z01411 Encounter for gynecological examination (general) (routine) with abnormal findings: Secondary | ICD-10-CM

## 2017-01-19 NOTE — Progress Notes (Addendum)
GYNECOLOGY ANNUAL PREVENTATIVE CARE ENCOUNTER NOTE  Subjective:   Sherry [name] is a 32 y.o. [id number] female here for a routine annual gynecologic exam.  Current complaints: vaginal odor intermittently. No particular timing in relation to sex or menses.   Denies abnormal vaginal bleeding, discharge, pelvic pain, problems with intercourse or other gynecologic concerns.    Gynecologic History Patient's last menstrual period was 01/09/2017. Patient is sexually active  Contraception: condoms Last Pap: 2016. Results were: normal Last mammogram: n/a.  Obstetric History OB History  Gravida Para Term Preterm AB Living  2 1 1  0 1 1  SAB TAB Ectopic Multiple Live Births  0 0 0 0 1    # Outcome Date GA Lbr Len/2nd Weight Sex Delivery Anes PTL Lv  2 Term 08/07/10 [redacted]w[redacted]d 04:00 / 00:57 8 lb 7.6 oz (3.844 kg) F Vag-Spont EPI  LIV  1 AB              Birth Comments: System Generated. Please review and update pregnancy details.      Past Medical History:  Diagnosis Date  . Mild cervical dysplasia   . Postpartum care following vaginal delivery 08/08/2010  . Type 2 HSV infection of vulvovaginal region     Past Surgical History:  Procedure Laterality Date  . DILATION AND CURETTAGE, DIAGNOSTIC / THERAPEUTIC      Current Outpatient Medications on File Prior to Visit  Medication Sig Dispense Refill  . acetaminophen (TYLENOL) 325 MG tablet Take 650 mg by mouth every 6 (six) hours as needed for moderate pain.    Marland Kitchen ibuprofen (ADVIL,MOTRIN) 600 MG tablet Take 1 tablet (600 mg total) by mouth every 6 (six) hours as needed. 30 tablet 0  . benzocaine-menthol (CHLORASEPTIC) 6-10 MG lozenge Take 1 lozenge by mouth as needed for sore throat. 100 tablet 0  . clindamycin (CLEOCIN) 300 MG capsule Take 1 capsule (300 mg total) by mouth 2 (two) times daily. 14 capsule 0  . metroNIDAZOLE (FLAGYL) 500 MG tablet Take 1 tablet (500 mg total) by mouth 2 (two) times daily. 14 tablet 7  . metroNIDAZOLE (FLAGYL) 500  MG tablet Take 1 tablet (500 mg total) by mouth 2 (two) times daily. 14 tablet 0  . penicillin v potassium (VEETID) 500 MG tablet Take 1 tablet (500 mg total) by mouth 3 (three) times daily. 30 tablet 0  . sulfamethoxazole-trimethoprim (BACTRIM DS,SEPTRA DS) 800-160 MG tablet Take 1 tablet by mouth 2 (two) times daily. 14 tablet 0  . valACYclovir (VALTREX) 500 MG tablet Take 1 tablet (500 mg total) by mouth 2 (two) times daily. (Patient not taking: Reported on 10/10/2015) 60 tablet 3   No current facility-administered medications on file prior to visit.     No Known Allergies  Social History   Socioeconomic History  . Marital status: Single    Spouse name: Not on file  . Number of children: Not on file  . Years of education: Not on file  . Highest education level: Not on file  Social Needs  . Financial resource strain: Not on file  . Food insecurity - worry: Not on file  . Food insecurity - inability: Not on file  . Transportation needs - medical: Not on file  . Transportation needs - non-medical: Not on file  Occupational History  . Not on file  Tobacco Use  . Smoking status: Never Smoker  . Smokeless tobacco: Never Used  Substance and Sexual Activity  . Alcohol use: Yes  Comment: social   . Drug use: No  . Sexual activity: Yes    Birth control/protection: Condom  Other Topics Concern  . Not on file  Social History Narrative  . Not on file    History reviewed. No pertinent family history.  [name], current medications, past family history, past medical history, past social history, past surgical history and problem list.  Review of Systems Pertinent items are noted in HPI.   Objective:  BP 106/71   Pulse 68   Ht 5\' 9"  (1.753 m)   Wt 189 lb 9.6 oz (86 kg)   LMP 01/09/2017   BMI 28.00 kg/m  CONSTITUTIONAL: Well-developed, well-nourished female in no acute distress.    HENT:  Normocephalic, atraumatic, External right and left ear normal. Oropharynx is clear and moist EYES: Conjunctivae and EOM are normal. Pupils are equal, round, and reactive to light. No scleral icterus.  NECK: Normal range of motion, supple, no masses.  Normal thyroid.   CARDIOVASCULAR: Normal heart rate noted, regular rhythm RESPIRATORY: Clear to auscultation bilaterally. Effort and breath sounds normal, no problems with respiration noted. BREASTS: Offered breast exam - Declined exam ABDOMEN: Soft, normal bowel sounds, no distention noted.  No tenderness, rebound or guarding.  PELVIC: Normal appearing external genitalia; normal appearing vaginal mucosa and cervix.  No abnormal discharge noted.  Pap smear obtained.  Normal uterine size, no other palpable masses, no uterine or adnexal tenderness. MUSCULOSKELETAL: Normal range of motion. No tenderness.  No cyanosis, clubbing, or edema.  2+ distal pulses. SKIN: Skin is warm and dry. No rash noted. Not diaphoretic. No erythema. No pallor. NEUROLOGIC: Alert and oriented to person, place, and time. Normal reflexes, muscle tone coordination. No cranial nerve deficit noted. PSYCHIATRIC: Normal mood and affect. Normal behavior. Normal judgment and thought content.  Assessment:  Annual gynecologic examination with pap smear   Plan:  1. Annual exam Will follow up results of pap smear and manage accordingly. STD testing discussed. Patient requested testing: HIV, GC/CT Discussed exercise and diet No other contraception desired   2. Vaginal Odor Wet prep - will follow and treat with antibiotics if necessary Probiotic   Routine preventative health maintenance measures emphasized. Please refer to After Visit Summary for other counseling recommendations.    Candelaria CelesteJacob Croy Drumwright, DO [facility]

## 2017-01-19 NOTE — Progress Notes (Signed)
C/o vaginal odor that occurs frequently.

## 2017-01-19 NOTE — Patient Instructions (Signed)

## 2017-01-20 LAB — CYTOLOGY - PAP
Chlamydia: NEGATIVE
DIAGNOSIS: NEGATIVE
Neisseria Gonorrhea: NEGATIVE

## 2017-01-20 LAB — CERVICOVAGINAL ANCILLARY ONLY
BACTERIAL VAGINITIS: NEGATIVE
Candida vaginitis: POSITIVE — AB
Trichomonas: NEGATIVE

## 2017-01-20 LAB — HIV ANTIBODY (ROUTINE TESTING W REFLEX): HIV SCREEN 4TH GENERATION: NONREACTIVE

## 2017-01-22 ENCOUNTER — Other Ambulatory Visit: Payer: Self-pay | Admitting: Family Medicine

## 2017-01-22 ENCOUNTER — Encounter: Payer: Self-pay | Admitting: Family Medicine

## 2017-01-22 MED ORDER — FLUCONAZOLE 150 MG PO TABS: 150.0000 mg | ORAL_TABLET | Freq: Once | ORAL | 0 refills | Status: AC

## 2017-01-23 ENCOUNTER — Other Ambulatory Visit: Payer: Self-pay | Admitting: General Practice

## 2017-01-23 DIAGNOSIS — B379 Candidiasis, unspecified: Secondary | ICD-10-CM

## 2017-01-23 DIAGNOSIS — N898 Other specified noninflammatory disorders of vagina: Secondary | ICD-10-CM

## 2017-01-23 MED ORDER — FLUCONAZOLE 150 MG PO TABS: 150.0000 mg | ORAL_TABLET | Freq: Once | ORAL | 0 refills | Status: AC

## 2017-06-11 ENCOUNTER — Ambulatory Visit (INDEPENDENT_AMBULATORY_CARE_PROVIDER_SITE_OTHER): Payer: Self-pay | Admitting: Family Medicine

## 2017-06-11 VITALS — BP 95/75 | HR 90 | Temp 98.1°F | Resp 16 | Wt 188.0 lb

## 2017-06-11 DIAGNOSIS — Z0000 Encounter for general adult medical examination without abnormal findings: Secondary | ICD-10-CM

## 2017-06-11 NOTE — Progress Notes (Signed)
Sherry [name] is a 32 y.o. female who presents today with concerns of a need for a physical exam and she denies chronic health conditons   Review of Systems  Constitutional: Negative for chills, fever and malaise/fatigue.  HENT: Negative for congestion, ear discharge, ear pain, sinus pain and sore throat.   Eyes: Negative.   Respiratory: Negative for cough, sputum production and shortness of breath.   Cardiovascular: Negative.  Negative for chest pain.  Gastrointestinal: Negative for abdominal pain, diarrhea, nausea and vomiting.  Genitourinary: Negative for dysuria, frequency, hematuria and urgency.  Musculoskeletal: Negative for myalgias.  Skin: Negative.   Neurological: Negative for headaches.  Endo/Heme/Allergies: Negative.   Psychiatric/Behavioral: Negative.     O: Vitals:   06/11/17 1245  BP: 95/75  Pulse: 90  Resp: 16  Temp: 98.1 F (36.7 C)  SpO2: 98%     Physical Exam  Constitutional: She is oriented to person, place, and time. Vital signs are normal. She appears well-developed and well-nourished. She is active.  Non-toxic appearance. She does not have a sickly appearance.  HENT:  Head: Normocephalic.  Right Ear: Hearing, tympanic membrane, external ear and ear canal normal.  Left Ear: Hearing, tympanic membrane, external ear and ear canal normal.  Nose: Nose normal.  Mouth/Throat: Uvula is midline and oropharynx is clear and moist.  Neck: Normal range of motion. Neck supple.  Cardiovascular: Normal rate, regular rhythm, normal heart sounds and normal pulses.  Pulmonary/Chest: Effort normal and breath sounds normal.  Abdominal: Soft. Bowel sounds are normal.  Musculoskeletal: Normal range of motion.  Lymphadenopathy:       Head (right side): No submental and no submandibular adenopathy present.       Head (left side): No submental and no submandibular adenopathy present.    She has no cervical adenopathy.  Neurological: She is alert and oriented to person,  place, and time. She has normal strength. No cranial nerve deficit or sensory deficit. She displays a negative Romberg sign. GCS eye subscore is 4. GCS verbal subscore is 5. GCS motor subscore is 6.  Cognitive recall- WNL PHQ-9- negative  Psychiatric: She has a normal mood and affect.  Vitals reviewed.    A: 1. Physical exam      P: Exam findings, diagnosis etiology and medication use and indications reviewed with patient. Follow- Up and discharge instructions provided. No emergent/urgent issues found on exam.  Patient verbalized understanding of information provided and agrees with plan of care (POC), all questions answered.  1. Physical exam Advised a comprehensive annual exam with PCP.

## 2017-06-11 NOTE — Patient Instructions (Signed)

## 2017-06-15 MED ORDER — METRONIDAZOLE 500 MG PO TABS: 500.0000 mg | ORAL_TABLET | Freq: Two times a day (BID) | ORAL | 0 refills | Status: DC

## 2017-08-14 ENCOUNTER — Ambulatory Visit (INDEPENDENT_AMBULATORY_CARE_PROVIDER_SITE_OTHER): Payer: 59 | Admitting: *Deleted

## 2017-08-14 DIAGNOSIS — N898 Other specified noninflammatory disorders of vagina: Secondary | ICD-10-CM

## 2017-08-14 DIAGNOSIS — Z113 Encounter for screening for infections with a predominantly sexual mode of transmission: Secondary | ICD-10-CM | POA: Diagnosis not present

## 2017-08-14 DIAGNOSIS — R300 Dysuria: Secondary | ICD-10-CM

## 2017-08-14 LAB — POCT URINALYSIS DIP (DEVICE)
Bilirubin Urine: NEGATIVE
Glucose, UA: NEGATIVE mg/dL
HGB URINE DIPSTICK: NEGATIVE
KETONES UR: NEGATIVE mg/dL
Nitrite: NEGATIVE
PH: 6 (ref 5.0–8.0)
Protein, ur: NEGATIVE mg/dL
Specific Gravity, Urine: 1.025 (ref 1.005–1.030)
UROBILINOGEN UA: 0.2 mg/dL (ref 0.0–1.0)

## 2017-08-14 NOTE — Progress Notes (Signed)
C/o burning with urination and vaginal discharge. Wants urine and wet prep checked. UA negative. Wet prep collected by self swab and sent.

## 2017-08-15 NOTE — Progress Notes (Signed)
I have reviewed the chart and agree with nursing staff's documentation of this patient's encounter.  Catalina AntiguaPeggy Jamesa Tedrick, MD 08/15/2017 8:35 AM

## 2017-08-17 ENCOUNTER — Telehealth: Payer: Self-pay | Admitting: Obstetrics & Gynecology

## 2017-08-17 LAB — CERVICOVAGINAL ANCILLARY ONLY
BACTERIAL VAGINITIS: NEGATIVE
Candida vaginitis: NEGATIVE
Chlamydia: NEGATIVE
NEISSERIA GONORRHEA: NEGATIVE
Trichomonas: NEGATIVE

## 2017-08-17 NOTE — Telephone Encounter (Signed)
Patient called requesting her results. Please call back and leave a detailed message in her voicemail box.

## 2017-08-18 ENCOUNTER — Telehealth: Payer: Self-pay

## 2017-08-18 NOTE — Telephone Encounter (Signed)
Call patient to inform her of negative test results.  

## 2018-01-10 ENCOUNTER — Emergency Department (HOSPITAL_COMMUNITY)
Admission: EM | Admit: 2018-01-10 | Discharge: 2018-01-10 | Disposition: A | Discharge status: Home / Self Care | Payer: 59 | Attending: Emergency Medicine | Admitting: Emergency Medicine

## 2018-01-10 ENCOUNTER — Encounter (HOSPITAL_COMMUNITY): Payer: Self-pay | Admitting: Emergency Medicine

## 2018-01-10 ENCOUNTER — Other Ambulatory Visit: Payer: Self-pay

## 2018-01-10 DIAGNOSIS — J029 Acute pharyngitis, unspecified: Secondary | ICD-10-CM | POA: Insufficient documentation

## 2018-01-10 DIAGNOSIS — Z79899 Other long term (current) drug therapy: Secondary | ICD-10-CM | POA: Diagnosis not present

## 2018-01-10 LAB — GROUP A STREP BY PCR: GROUP A STREP BY PCR: NOT DETECTED

## 2018-01-10 MED ORDER — PENICILLIN G BENZATHINE 1200000 UNIT/2ML IM SUSP
1.2000 10*6.[IU] | Freq: Once | INTRAMUSCULAR | Status: AC
  Administered 2018-01-10: 1.2 10*6.[IU] via INTRAMUSCULAR
  Filled 2018-01-10: qty 2

## 2018-01-10 NOTE — Discharge Instructions (Signed)
You can take Tylenol or Ibuprofen as directed for pain. You can alternate Tylenol and Ibuprofen every 4 hours. If you take Tylenol at 1pm, then you can take Ibuprofen at 5pm. Then you can take Tylenol again at 9pm.   Make sure you are drinking plenty of fluids and staying hydrated.  Return emergency department for any worsening pain, difficulty swallowing your saliva, difficulty swallowing any liquids or food or any other worsening or concerning symptoms.

## 2018-01-10 NOTE — ED Notes (Signed)
Bed: WTR9 Expected date:  Expected time:  Means of arrival:  Comments: 

## 2018-01-10 NOTE — ED Provider Notes (Signed)
[facility]-EMERGENCY DEPT Provider Note   CSN: [id number] Arrival date & time: 01/10/18  2016     History   Chief Complaint Chief Complaint  Patient presents with  . Sore Throat    HPI Sherry [name] is a 32 y.o. female who presents for evaluation of sore throat that is been ongoing for last several days.  Patient states that she typically gets sore throat about once or twice a year around this time.  Patient states for the last for several days, she has felt like she has had strep throat.  She states that her throat has been red and she had noticed white spots.  Patient states that is been painful to swallow but she has been able to tolerate her secretions.  She reports been taking ibuprofen with no improvement in symptoms.  She has not had any fever.  Patient states she has not had difficulty breathing, vomiting.  The history is provided by the patient.    Past Medical History:  Diagnosis Date  . Mild cervical dysplasia   . Postpartum care following vaginal delivery 08/08/2010  . Type 2 HSV infection of vulvovaginal region     Patient Active Problem List   Diagnosis Date Noted  . Bacterial vaginitis 06/26/2016  . 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 Postpartum care following vaginal delivery 08/08/2010    Past Surgical History:  Procedure Laterality Date  . DILATION AND CURETTAGE, DIAGNOSTIC / THERAPEUTIC       OB History    Gravida  2   Para  1   Term  1   Preterm  0   AB  1   Living  1     SAB  0   TAB  0   Ectopic  0   Multiple  0   Live Births  1            Home Medications    Prior to Admission medications   Medication Sig Start Date End Date Taking? Authorizing Provider  acetaminophen (TYLENOL) 325 MG tablet Take 650 mg by mouth every 6 (six) hours as needed for moderate pain.    [provider]  clindamycin (CLEOCIN) 300 MG capsule Take 1  capsule (300 mg total) by mouth 2 (two) times daily. Patient not taking: Reported on 06/11/2017 10/17/15   Amedeo GoryKarim-Rhoades, Walidah N, CNM  ibuprofen (ADVIL,MOTRIN) 600 MG tablet Take 1 tablet (600 mg total) by mouth every 6 (six) hours as needed. 12/07/15   Janne NapoleonNeese, Hope M, NP  metroNIDAZOLE (FLAGYL) 500 MG tablet Take 1 tablet (500 mg total) by mouth 2 (two) times daily. 06/15/17   Allie Bossierove, Myra C, MD  penicillin v potassium (VEETID) 500 MG tablet Take 1 tablet (500 mg total) by mouth 3 (three) times daily. Patient not taking: Reported on 06/11/2017 12/07/15   Janne NapoleonNeese, Hope M, NP  sulfamethoxazole-trimethoprim (BACTRIM DS,SEPTRA DS) 800-160 MG tablet Take 1 tablet by mouth 2 (two) times daily. 11/06/16   Mardella LaymanHagler, Brian, MD  valACYclovir (VALTREX) 500 MG tablet Take 1 tablet (500 mg total) by mouth 2 (two) times daily. Patient not taking: Reported on 10/10/2015 04/12/12   Constant, Gigi GinPeggy, MD    Family History History reviewed. No pertinent family history.  Social History Social History   Tobacco Use  . Smoking status: Never Smoker  . Smokeless tobacco: Never Used  Substance Use Topics  . Alcohol use: Yes    Comment: social   .  Drug use: No     Allergies   Patient has no known allergies.   Review of Systems Review of Systems  Constitutional: Negative for fever.  HENT: Positive for sore throat. Negative for drooling and trouble swallowing.   Genitourinary: Negative for hematuria.  All other systems reviewed and are negative.    Physical Exam Updated Vital Signs BP 116/77 (BP Location: Right Arm)   Pulse 80   Temp 98.9 F (37.2 C) (Oral)   Resp 18   Ht 5\' 9"  (1.753 m)   Wt 83.9 kg   SpO2 100%   BMI 27.32 kg/m   Physical Exam Vitals signs and nursing note reviewed.  Constitutional:      Appearance: She is well-developed.  HENT:     Head: Normocephalic and atraumatic.     Mouth/Throat:     Mouth: Mucous membranes are moist.     Pharynx: Uvula midline. Posterior oropharyngeal  erythema present.     Tonsils: Tonsillar exudate present.     Comments: Airways patent, patient is intact.  Uvula is midline.  No trismus.  Posterior oropharynx is erythematous with some tonsillar edema as well as tonsillar exudates. Eyes:     General: No scleral icterus.       Right eye: No discharge.        Left eye: No discharge.     Conjunctiva/sclera: Conjunctivae normal.  Pulmonary:     Effort: Pulmonary effort is normal.  Lymphadenopathy:     Cervical: Cervical adenopathy present.  Skin:    General: Skin is warm and dry.  Neurological:     Mental Status: She is alert.  Psychiatric:        Speech: Speech normal.        Behavior: Behavior normal.      ED Treatments / Results  Labs (all labs ordered are listed, but only abnormal results are displayed) Labs Reviewed  GROUP A STREP BY PCR    EKG None  Radiology No results found.  Procedures Procedures (including critical care time)  Medications Ordered in ED Medications  penicillin g benzathine (BICILLIN LA) 1200000 UNIT/2ML injection 1.2 Million Units (1.2 Million Units Intramuscular Given 01/10/18 2149)     Initial Impression / Assessment and Plan / ED Course  I have reviewed the triage vital signs and the nursing notes.  Pertinent labs & imaging results that were available during my care of the patient were reviewed by me and considered in my medical decision making (see chart for details).     [age] year old female who presents for evaluation of sore throat x3 days.  No fevers.  Reports pain with swallowing but is able to tolerate her secretions and p.o. with any difficulty. Patient is afebrile, non-toxic appearing, sitting comfortably on examination table. Vital signs reviewed and stable.  On exam, posterior oropharynx is erythematous with edema and tonsillar exudates.  She does have some cervical lymphadenopathy.  History/physical exam is not concerning for peritonsillar abscess or Ludwig angina.  Consider  pharyngitis.  Strep reviewed and negative.  I discussed results with patient.  Given her exudates, erythema and edema, I did offer treatment options with patient.  Patient states she is getting ready to travel out of the country and would prefer to get treated today.  Patient no known drug allergies. Patient had ample opportunity for questions and discussion. All patient's questions were answered with full understanding. Strict return precautions discussed. Patient expresses understanding and agreement to plan.    Portions of this note  were generated with Scientist, clinical (histocompatibility and immunogenetics). Dictation errors may occur despite best attempts at proofreading.    Final Clinical Impressions(s) / ED Diagnoses   Final diagnoses:  Pharyngitis, unspecified etiology    ED Discharge Orders    None       Rosana Hoes 01/10/18 2219    Charlynne Pander, MD 01/10/18 [phone]

## 2018-01-10 NOTE — ED Triage Notes (Signed)
Pt presents with c/o sore throat. Tonsil inflammation and pain and hx of strep.

## 2018-01-20 ENCOUNTER — Ambulatory Visit: Payer: 59 | Admitting: Obstetrics & Gynecology

## 2018-01-25 ENCOUNTER — Ambulatory Visit (INDEPENDENT_AMBULATORY_CARE_PROVIDER_SITE_OTHER): Payer: No Typology Code available for payment source | Admitting: Obstetrics and Gynecology

## 2018-01-25 ENCOUNTER — Encounter: Payer: Self-pay | Admitting: Obstetrics and Gynecology

## 2018-01-25 VITALS — BP 128/81 | HR 93 | Ht 69.0 in | Wt 198.2 lb

## 2018-01-25 DIAGNOSIS — Z30430 Encounter for insertion of intrauterine contraceptive device: Secondary | ICD-10-CM | POA: Diagnosis not present

## 2018-01-25 LAB — POCT PREGNANCY, URINE: Preg Test, Ur: NEGATIVE

## 2018-01-25 MED ORDER — IBUPROFEN 200 MG PO TABS
800.0000 mg | ORAL_TABLET | Freq: Once | ORAL | Status: AC
  Administered 2018-01-25: 800 mg via ORAL

## 2018-01-25 MED ORDER — LEVONORGESTREL 19.5 MCG/DAY IU IUD
INTRAUTERINE_SYSTEM | Freq: Once | INTRAUTERINE | Status: AC
  Administered 2018-01-25: 18:00:00 via INTRAUTERINE

## 2018-01-25 NOTE — Patient Instructions (Signed)
IUD PLACEMENT POST-PROCEDURE INSTRUCTIONS  1. You may take Ibuprofen, Aleve or Tylenol for pain if needed.  Cramping should resolve within in 24 hours.  2. You may have a small amount of spotting.  You should wear a mini pad for the next few days.  3. You may have intercourse after 24 hours.  If you using this for birth control, it is effective immediately.  4. You need to call if you have any pelvic pain, fever, heavy bleeding or foul smelling vaginal discharge.  Irregular bleeding is common the first several months after having an IUD placed. You do not need to call for this reason unless you are concerned.  5. Shower or bathe as normal  6. You should have a follow-up appointment in 4-8 weeks for a re-check to make sure you are not having any problems.  Levonorgestrel intrauterine device (IUD) What is this medicine? LEVONORGESTREL IUD (LEE voe nor jes trel) is a contraceptive (birth control) device. The device is placed inside the uterus by a healthcare professional. It is used to prevent pregnancy. This device can also be used to treat heavy bleeding that occurs during your period. This medicine may be used for other purposes; ask your health care provider or pharmacist if you have questions. COMMON BRAND NAME(S): Kyleena, LILETTA, Mirena, Skyla What should I tell my health care provider before I take this medicine? They need to know if you have any of these conditions: -abnormal Pap smear -cancer of the breast, uterus, or cervix -diabetes -endometritis -genital or pelvic infection now or in the past -have more than one sexual partner or your partner has more than one partner -heart disease -history of an ectopic or tubal pregnancy -immune system problems -IUD in place -liver disease or tumor -problems with blood clots or take blood-thinners -seizures -use intravenous drugs -uterus of unusual shape -vaginal bleeding that has not been explained -an unusual or allergic reaction  to levonorgestrel, other hormones, silicone, or polyethylene, medicines, foods, dyes, or preservatives -pregnant or trying to get pregnant -breast-feeding How should I use this medicine? This device is placed inside the uterus by a health care professional. Talk to your pediatrician regarding the use of this medicine in children. Special care may be needed. Overdosage: If you think you have taken too much of this medicine contact a poison control center or emergency room at once. NOTE: This medicine is only for you. Do not share this medicine with others. What if I miss a dose? This does not apply. Depending on the brand of device you have inserted, the device will need to be replaced every 3 to 5 years if you wish to continue using this type of birth control. What may interact with this medicine? Do not take this medicine with any of the following medications: -amprenavir -bosentan -fosamprenavir This medicine may also interact with the following medications: -aprepitant -armodafinil -barbiturate medicines for inducing sleep or treating seizures -bexarotene -boceprevir -griseofulvin -medicines to treat seizures like carbamazepine, ethotoin, felbamate, oxcarbazepine, phenytoin, topiramate -modafinil -pioglitazone -rifabutin -rifampin -rifapentine -some medicines to treat HIV infection like atazanavir, efavirenz, indinavir, lopinavir, nelfinavir, tipranavir, ritonavir -St. John's wort -warfarin This list may not describe all possible interactions. Give your health care provider a list of all the medicines, herbs, non-prescription drugs, or dietary supplements you use. Also tell them if you smoke, drink alcohol, or use illegal drugs. Some items may interact with your medicine. What should I watch for while using this medicine? Visit your doctor or health care   professional for regular check ups. See your doctor if you or your partner has sexual contact with others, becomes HIV positive,  or gets a sexual transmitted disease. This product does not protect you against HIV infection (AIDS) or other sexually transmitted diseases. You can check the placement of the IUD yourself by reaching up to the top of your vagina with clean fingers to feel the threads. Do not pull on the threads. It is a good habit to check placement after each menstrual period. Call your doctor right away if you feel more of the IUD than just the threads or if you cannot feel the threads at all. The IUD may come out by itself. You may become pregnant if the device comes out. If you notice that the IUD has come out use a backup birth control method like condoms and call your health care provider. Using tampons will not change the position of the IUD and are okay to use during your period. This IUD can be safely scanned with magnetic resonance imaging (MRI) only under specific conditions. Before you have an MRI, tell your healthcare provider that you have an IUD in place, and which type of IUD you have in place. What side effects may I notice from receiving this medicine? Side effects that you should report to your doctor or health care professional as soon as possible: -allergic reactions like skin rash, itching or hives, swelling of the face, lips, or tongue -fever, flu-like symptoms -genital sores -high blood pressure -no menstrual period for 6 weeks during use -pain, swelling, warmth in the leg -pelvic pain or tenderness -severe or sudden headache -signs of pregnancy -stomach cramping -sudden shortness of breath -trouble with balance, talking, or walking -unusual vaginal bleeding, discharge -yellowing of the eyes or skin Side effects that usually do not require medical attention (report to your doctor or health care professional if they continue or are bothersome): -acne -breast pain -change in sex drive or performance -changes in weight -cramping, dizziness, or faintness while the device is being  inserted -headache -irregular menstrual bleeding within first 3 to 6 months of use -nausea This list may not describe all possible side effects. Call your doctor for medical advice about side effects. You may report side effects to FDA at 1-800-FDA-1088. Where should I keep my medicine? This does not apply. NOTE: This sheet is a summary. It may not cover all possible information. If you have questions about this medicine, talk to your doctor, pharmacist, or health care provider.  2019 Elsevier/Gold Standard (2015-10-12 14:14:56)  

## 2018-01-25 NOTE — Addendum Note (Signed)
Addended by: Jill SideAY, Jyra Lagares L on: 01/25/2018 06:39 PM   Modules accepted: Orders

## 2018-01-25 NOTE — Progress Notes (Signed)
Patient ID: Sherry [name], female   DOB: Jul 03, 1985, 33 y.o.   MRN: [id number] Patient identified, informed consent performed, signed copy in chart, time out was performed.  Urine pregnancy test negative.  Speculum placed in the vagina. Cervix visualized. Cleaned with Betadine x 2. Grasped anteriourly with a single tooth tenaculum.  Uterus sounded to 6 cm.  Liletta IUD placed per manufacturer's recommendations.  Strings trimmed to 3 cm.   Patient tolerated the procedure well. Patient given post procedure instructions and Liletta care card with expiration date. Patient is asked to check IUD strings periodically and follow up in 4 weeks for IUD check.  Raelyn Mora, CNM

## 2018-01-28 ENCOUNTER — Encounter: Payer: Self-pay | Admitting: Internal Medicine

## 2018-01-28 ENCOUNTER — Ambulatory Visit (INDEPENDENT_AMBULATORY_CARE_PROVIDER_SITE_OTHER): Payer: No Typology Code available for payment source | Admitting: Internal Medicine

## 2018-01-28 VITALS — BP 128/72 | HR 81 | Wt 195.3 lb

## 2018-01-28 DIAGNOSIS — Z30431 Encounter for routine checking of intrauterine contraceptive device: Secondary | ICD-10-CM

## 2018-01-28 NOTE — Patient Instructions (Signed)
Your strings appear to be the appropriate length right now. I suspect since the device is so new the strings have not had time to soften and curl up around the cervix and that is why it is causing irritation. I would recommend returning for your appointment with Rolita to check to see if they are still bothering you and if they should be trimmed at that time.

## 2018-01-28 NOTE — Progress Notes (Signed)
C/o strings are bothering her and her partner.

## 2018-01-30 NOTE — Progress Notes (Signed)
   Subjective:    Sherry [name] - 33 y.o. female MRN [id number]  Date of birth: Jul 15, 1985  HPI  Sherry [name] is a 33 y.o. G79P1011 female here for complaint about IUD strings. Strings are bothering her and her partner during intercourse.  IUD was recently inserted on 1/13. Denies pain outside of intercourse. Requests exam to see if strings need to be cut. Denies abnormal vaginal bleeding or discharge.     OB History    Gravida  2   Para  1   Term  1   Preterm  0   AB  1   Living  1     SAB  0   TAB  0   Ectopic  0   Multiple  0   Live Births  1            -  reports that she has never smoked. She has never used smokeless tobacco. - Review of Systems: Per HPI. - Past Medical History: Patient Active Problem List   Diagnosis Date Noted  . Bacterial vaginitis 06/26/2016  . 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 Postpartum care following vaginal delivery 08/08/2010   - Medications: reviewed and updated   Objective:   Physical Exam BP 128/72   Pulse 81   Wt 195 lb 4.8 oz (88.6 kg)   LMP 01/14/2018 (Exact Date)   BMI 28.84 kg/m  Gen: NAD, alert, cooperative with exam, well-appearing GU/GYN: Exam performed in the presence of a chaperone. External genitalia within normal limits.  Vaginal mucosa pink, moist, normal rugae.  Nonfriable cervix without lesions, no discharge or bleeding noted on speculum exam.  IUD strings visualized. Bimanual exam revealed normal, nongravid uterus.  No cervical motion tenderness. No adnexal masses bilaterally.           Assessment & Plan:   1. IUD check up Discussed with patient that IUD is in appropriate place and that strings usually soften and curl up with time. As IUD was just recently placed, anticipate that they may be stiff and bothersome during intercourse at this point but would not recommend trimming yet. Patient to return 4 weeks after placement for string check.     Routine preventative health maintenance measures emphasized. Please refer to After Visit Summary for other counseling recommendations.   Return in about 3 weeks (around 02/18/2018) for IUD check .  Sherry [name], D.O. OB Fellow  01/30/2018, 9:30 PM

## 2018-02-22 ENCOUNTER — Encounter: Payer: Self-pay | Admitting: Advanced Practice Midwife

## 2018-02-22 ENCOUNTER — Ambulatory Visit (INDEPENDENT_AMBULATORY_CARE_PROVIDER_SITE_OTHER): Payer: No Typology Code available for payment source | Admitting: Advanced Practice Midwife

## 2018-02-22 VITALS — BP 109/70 | HR 75 | Wt 196.0 lb

## 2018-02-22 DIAGNOSIS — Z30431 Encounter for routine checking of intrauterine contraceptive device: Secondary | ICD-10-CM | POA: Diagnosis not present

## 2018-02-22 NOTE — Progress Notes (Signed)
GYNECOLOGY OFFICE PROGRESS NOTE  History:  [name] here today for today for IUD string check; Liletta IUD was placed  01/25/2018. No complaints about the IUD. She is still having irregular spotting. Using a pantiliner daily.   [name], current medications, past family history, past medical history, past social history, past surgical history and problem list. Last pap smear on 01/19/2017 was normal.  Review of Systems:  Pertinent items are noted in HPI.   Objective:  Physical Exam Blood pressure 109/70, pulse 75, weight 196 lb (88.9 kg). CONSTITUTIONAL: Well-developed, well-nourished female in no acute distress.  HENT:  Normocephalic, atraumatic. External right and left ear normal. Oropharynx is clear and moist EYES: Conjunctivae and EOM are normal. Pupils are equal, round, and reactive to light. No scleral icterus.  NECK: Normal range of motion, supple, no masses CARDIOVASCULAR: Normal heart rate noted RESPIRATORY: Effort and breath sounds normal, no problems with respiration noted ABDOMEN: Soft, no distention noted.   PELVIC: Normal appearing external genitalia; normal appearing vaginal mucosa and cervix.  IUD strings visualized, about 2 cm in length outside cervix.   Assessment & Plan:  Normal IUD check. Patient to keep IUD in place for five years; can come in for removal if she desires pregnancy within the next five years. Routine preventative health maintenance measures emphasized.  Thressa Sheller DNP, CNM  02/22/18  4:06 PM

## 2018-02-22 NOTE — Patient Instructions (Signed)

## 2018-03-05 ENCOUNTER — Ambulatory Visit (INDEPENDENT_AMBULATORY_CARE_PROVIDER_SITE_OTHER): Payer: No Typology Code available for payment source | Admitting: Family Medicine

## 2018-03-05 ENCOUNTER — Encounter: Payer: Self-pay | Admitting: Family Medicine

## 2018-03-05 VITALS — BP 115/78 | HR 91 | Wt 199.9 lb

## 2018-03-05 DIAGNOSIS — B373 Candidiasis of vulva and vagina: Secondary | ICD-10-CM

## 2018-03-05 DIAGNOSIS — N898 Other specified noninflammatory disorders of vagina: Secondary | ICD-10-CM | POA: Diagnosis not present

## 2018-03-05 DIAGNOSIS — Z113 Encounter for screening for infections with a predominantly sexual mode of transmission: Secondary | ICD-10-CM

## 2018-03-05 DIAGNOSIS — Z30431 Encounter for routine checking of intrauterine contraceptive device: Secondary | ICD-10-CM | POA: Diagnosis not present

## 2018-03-05 NOTE — Progress Notes (Signed)
   Subjective:    Sherry [name] - 33 y.o. female MRN [id number]  Date of birth: 23-Nov-1985  HPI  Sherry [name] is a 33 y.o. G34P1011 female here for IUD string check. She had her IUD placed about a month ago. States that her partner can feel the strings intermittently during intercourse.  She's also having some vaginal itching and reports recurrent bacterial vaginosis so would like to be checked today.      OB History    Gravida  2   Para  1   Term  1   Preterm  0   AB  1   Living  1     SAB  0   TAB  0   Ectopic  0   Multiple  0   Live Births  1             Health Maintenance:   Health Maintenance Due  Topic Date Due  . INFLUENZA VACCINE  08/13/2017    -  reports that she has never smoked. She has never used smokeless tobacco. - Review of Systems: Per HPI. - Past Medical History: Patient Active Problem List   Diagnosis Date Noted  . Bacterial vaginitis 06/26/2016  . 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 Postpartum care following vaginal delivery 08/08/2010   - Medications: reviewed and updated   Objective:   Physical Exam BP 115/78   Pulse 91   Wt 199 lb 14.4 oz (90.7 kg)   BMI 29.52 kg/m  Gen: NAD, alert, cooperative with exam, well-appearing HEENT: NCAT, PERRL, clear conjunctiva, oropharynx clear, supple neck CV: RRR, good S1/S2, no murmur, no edema, capillary refill brisk  Resp: CTABL, no wheezes, non-labored Abd: SNTND, BS present, no guarding or organomegaly Skin: no rashes, normal turgor  Neuro: no gross deficits.  Psych: good insight, alert and oriented GU/GYN: Exam performed in the presence of a chaperone. External genitalia within normal limits.  Vaginal mucosa pink, moist, normal rugae.  Nonfriable cervix without lesions, no bleeding noted on speculum exam.  Small amount of white discharge and mild malodor.      Assessment & Plan:   1. IUD check up - string ~2cm in  length - recommend expectant management since strings likely to soften in the coming months - follow up if no improvement   2. Vaginal itching - Cervicovaginal ancillary only( [place])   Routine preventative health maintenance measures emphasized. Please refer to After Visit Summary for other counseling recommendations.   Return if symptoms worsen or fail to improve.   Gwenevere Abbot, MD  OB Fellow  03/05/2018, 12:19 PM

## 2018-03-08 LAB — CERVICOVAGINAL ANCILLARY ONLY
BACTERIAL VAGINITIS: NEGATIVE
CANDIDA VAGINITIS: POSITIVE — AB
Chlamydia: NEGATIVE
Neisseria Gonorrhea: NEGATIVE
Trichomonas: NEGATIVE

## 2018-03-10 ENCOUNTER — Other Ambulatory Visit: Payer: Self-pay

## 2018-03-10 MED ORDER — FLUCONAZOLE 150 MG PO TABS: 150.0000 mg | ORAL_TABLET | Freq: Once | ORAL | 1 refills | Status: AC

## 2018-03-10 NOTE — Telephone Encounter (Signed)
Wet prep resulted yeast infection.  Diflucan e-prescribed per protocol.

## 2018-03-16 ENCOUNTER — Other Ambulatory Visit: Payer: Self-pay | Admitting: Family Medicine

## 2018-03-16 MED ORDER — FLUCONAZOLE 150 MG PO TABS: 150.0000 mg | ORAL_TABLET | Freq: Once | ORAL | 0 refills | Status: AC

## 2018-03-16 NOTE — Progress Notes (Signed)
Symptomatic vulvovaginal candidiasis. Rx for diflucan sent to pharmacy. Patient notified via mychart.   Gwenevere Abbot, MD 03/16/18 8:46 AM

## 2018-06-21 ENCOUNTER — Other Ambulatory Visit: Payer: Self-pay

## 2018-06-21 DIAGNOSIS — R58 Hemorrhage, not elsewhere classified: Secondary | ICD-10-CM

## 2018-06-21 MED ORDER — NORETHIN ACE-ETH ESTRAD-FE 1-20 MG-MCG(24) PO TABS: 1.0000 | ORAL_TABLET | Freq: Every day | ORAL | 5 refills | Status: DC

## 2018-06-21 NOTE — Progress Notes (Signed)
Patient called in for bleeding with IUD states she had been bleeding since she got it and needed something to help with it. Agreed upon taking BC Pills to resolve the bleeding and advised to call us if she needed anything. Patient verbalized understanding and had no questions.

## 2018-11-16 ENCOUNTER — Telehealth: Payer: No Typology Code available for payment source | Admitting: Physician Assistant

## 2018-11-16 ENCOUNTER — Telehealth: Payer: No Typology Code available for payment source | Admitting: Nurse Practitioner

## 2018-11-16 DIAGNOSIS — T7840XA Allergy, unspecified, initial encounter: Secondary | ICD-10-CM

## 2018-11-16 DIAGNOSIS — L309 Dermatitis, unspecified: Secondary | ICD-10-CM

## 2018-11-16 MED ORDER — PREDNISONE 10 MG PO TABS: ORAL_TABLET | ORAL | 0 refills | Status: DC

## 2018-11-16 MED ORDER — HYDROXYZINE HCL 25 MG PO TABS: 25.0000 mg | ORAL_TABLET | Freq: Four times a day (QID) | ORAL | 0 refills | Status: DC

## 2018-11-16 MED ORDER — PREDNISONE 10 MG (21) PO TBPK: ORAL_TABLET | ORAL | 0 refills | Status: DC

## 2018-11-16 NOTE — Progress Notes (Signed)
E-Visit 45min. Patient is a 33 year old female who is having problems with rash about the hands legs and arms.  This is been going on for about 5 days.  It is not itching type rash.  There is no reference to a contact related dermatitis.  There is no reference to the area is weeping or draining pus.  The areas do not itch.  The patient has tried hydrocortisone without success.  Prescription for prednisone taper and Vistaril will be ordered for this patient.

## 2018-11-16 NOTE — Progress Notes (Signed)
E Visit for Rash  We are sorry that you are not feeling well. Here is how we plan to help!  Based on your pictures and your description this looks like urticaria- which is an allergic reaction  To something you have eaten or drank. You could go to the urgent cre for steroid shot ir yu can take benadryl and start on prednisone dose pack that has been sent io your pharmacy    Prednisone 10 mg daily for 6 days (see taper instructions below)  Directions for 6 day taper: Day 1: 2 tablets before breakfast, 1 after both lunch & dinner and 2 at bedtime Day 2: 1 tab before breakfast, 1 after both lunch & dinner and 2 at bedtime Day 3: 1 tab at each meal & 1 at bedtime Day 4: 1 tab at breakfast, 1 at lunch, 1 at bedtime Day 5: 1 tab at breakfast & 1 tab at bedtime Day 6: 1 tab at breakfast     HOME CARE:   Take cool showers and avoid direct sunlight.  Apply cool compress or wet dressings.  Take a bath in an oatmeal bath.  Sprinkle content of one Aveeno packet under running faucet with comfortably warm water.  Bathe for 15-20 minutes, 1-2 times daily.  Pat dry with a towel. Do not rub the rash.  Use hydrocortisone cream.  Take an antihistamine like Benadryl for widespread rashes that itch.  The adult dose of Benadryl is 25-50 mg by mouth 4 times daily.  Caution:  This type of medication may cause sleepiness.  Do not drink alcohol, drive, or operate dangerous machinery while taking antihistamines.  Do not take these medications if you have prostate enlargement.  Read package instructions thoroughly on all medications that you take.  GET HELP RIGHT AWAY IF:   Symptoms don't go away after treatment.  Severe itching that persists.  If you rash spreads or swells.  If you rash begins to smell.  If it blisters and opens or develops a yellow-brown crust.  You develop a fever.  You have a sore throat.  You become short of breath.  MAKE SURE YOU:  Understand these  instructions. Will watch your condition. Will get help right away if you are not doing well or get worse.  Thank you for choosing an e-visit. Your e-visit answers were reviewed by a board certified advanced clinical practitioner to complete your personal care plan. Depending upon the condition, your plan could have included both over the counter or prescription medications. Please review your pharmacy choice. Be sure that the pharmacy you have chosen is open so that you can pick up your prescription now.  If there is a problem you may message your provider in [place] to have the prescription routed to another pharmacy. Your safety is important to [place]. If you have drug allergies check your prescription carefully.  For the next 24 hours, you can use MyChart to ask questions about today's visit, request a non-urgent call back, or ask for a work or school excuse from your e-visit provider. You will get an email in the next two days asking about your experience. I hope that your e-visit has been valuable and will speed your recovery.  5-10 minutes spent reviewing and documenting in chart.

## 2018-11-17 ENCOUNTER — Telehealth: Payer: Self-pay | Admitting: General Practice

## 2018-11-17 DIAGNOSIS — B9689 Other specified bacterial agents as the cause of diseases classified elsewhere: Secondary | ICD-10-CM

## 2018-11-17 MED ORDER — METRONIDAZOLE 500 MG PO TABS: 500.0000 mg | ORAL_TABLET | Freq: Two times a day (BID) | ORAL | 0 refills | Status: DC

## 2018-11-17 NOTE — Telephone Encounter (Signed)
Patient called into front office reporting vaginal odor similar to when she has had BV in the past. Informed Flagyl sent to pharmacy per protocol. Patient verbalized understanding & had no questions.

## 2018-12-31 ENCOUNTER — Other Ambulatory Visit: Payer: Self-pay

## 2018-12-31 ENCOUNTER — Ambulatory Visit (INDEPENDENT_AMBULATORY_CARE_PROVIDER_SITE_OTHER): Payer: No Typology Code available for payment source | Admitting: General Practice

## 2018-12-31 DIAGNOSIS — N898 Other specified noninflammatory disorders of vagina: Secondary | ICD-10-CM

## 2018-12-31 DIAGNOSIS — B373 Candidiasis of vulva and vagina: Secondary | ICD-10-CM | POA: Diagnosis not present

## 2018-12-31 DIAGNOSIS — Z113 Encounter for screening for infections with a predominantly sexual mode of transmission: Secondary | ICD-10-CM

## 2018-12-31 NOTE — Progress Notes (Signed)
Patient presents to office today requesting STD testing as well as testing for other infections-- has had vaginal odor/discharge off and on for approximately 2 months. Patient was instructed in self swab & specimen collected. Discussed results take approximately 24-48 hours to come back and will be released via mychart & any needed prescriptions will be sent to pharmacy. Patient verbalized understanding & had no questions.   Koren Bound RN BSN 12/31/18

## 2019-01-01 NOTE — Progress Notes (Signed)
Patient seen and assessed by nursing staff during this encounter. I have reviewed the chart and agree with the documentation and plan.  Aletha Halim, MD 01/01/2019 2:06 PM

## 2019-01-03 LAB — CERVICOVAGINAL ANCILLARY ONLY
Bacterial Vaginitis (gardnerella): NEGATIVE
Candida Glabrata: NEGATIVE
Candida Vaginitis: POSITIVE — AB
Chlamydia: NEGATIVE
Comment: NEGATIVE
Comment: NEGATIVE
Comment: NEGATIVE
Comment: NEGATIVE
Comment: NEGATIVE
Comment: NORMAL
Neisseria Gonorrhea: NEGATIVE
Trichomonas: NEGATIVE

## 2019-01-04 ENCOUNTER — Other Ambulatory Visit: Payer: Self-pay | Admitting: Obstetrics and Gynecology

## 2019-01-04 MED ORDER — FLUCONAZOLE 150 MG PO TABS: 150.0000 mg | ORAL_TABLET | Freq: Once | ORAL | 0 refills | Status: AC

## 2019-02-08 ENCOUNTER — Telehealth: Payer: Self-pay

## 2019-02-08 ENCOUNTER — Other Ambulatory Visit: Payer: Self-pay

## 2019-02-08 DIAGNOSIS — B009 Herpesviral infection, unspecified: Secondary | ICD-10-CM

## 2019-02-08 MED ORDER — VALACYCLOVIR HCL 500 MG PO TABS: 500.0000 mg | ORAL_TABLET | Freq: Two times a day (BID) | ORAL | 3 refills | Status: DC

## 2019-02-08 NOTE — Telephone Encounter (Signed)
Pt spoke with front office staff and requested a medication refill. Called pt; VM left stating I was following up about medication refill request. MyChart message sent to patient to ask which medication she would like refilled.

## 2019-05-26 ENCOUNTER — Ambulatory Visit: Payer: No Typology Code available for payment source | Admitting: Obstetrics and Gynecology

## 2019-06-02 ENCOUNTER — Ambulatory Visit: Payer: No Typology Code available for payment source | Admitting: Obstetrics and Gynecology

## 2019-08-02 ENCOUNTER — Ambulatory Visit: Payer: No Typology Code available for payment source | Admitting: Obstetrics and Gynecology

## 2019-08-03 ENCOUNTER — Encounter: Payer: Self-pay | Admitting: Obstetrics & Gynecology

## 2019-08-03 ENCOUNTER — Other Ambulatory Visit: Payer: Self-pay

## 2019-08-03 ENCOUNTER — Other Ambulatory Visit (HOSPITAL_COMMUNITY)
Admission: RE | Admit: 2019-08-03 | Discharge: 2019-08-03 | Disposition: A | Discharge status: Home / Self Care | Payer: BC Managed Care – PPO | Source: Ambulatory Visit | Attending: Obstetrics and Gynecology | Admitting: Obstetrics and Gynecology

## 2019-08-03 ENCOUNTER — Ambulatory Visit (INDEPENDENT_AMBULATORY_CARE_PROVIDER_SITE_OTHER): Payer: BC Managed Care – PPO | Admitting: Obstetrics & Gynecology

## 2019-08-03 VITALS — BP 114/73 | HR 87 | Wt 214.6 lb

## 2019-08-03 DIAGNOSIS — N760 Acute vaginitis: Secondary | ICD-10-CM | POA: Diagnosis not present

## 2019-08-03 DIAGNOSIS — N898 Other specified noninflammatory disorders of vagina: Secondary | ICD-10-CM | POA: Insufficient documentation

## 2019-08-03 DIAGNOSIS — B9689 Other specified bacterial agents as the cause of diseases classified elsewhere: Secondary | ICD-10-CM | POA: Diagnosis not present

## 2019-08-03 DIAGNOSIS — Z30431 Encounter for routine checking of intrauterine contraceptive device: Secondary | ICD-10-CM | POA: Diagnosis not present

## 2019-08-03 NOTE — Progress Notes (Signed)
   GYNECOLOGY OFFICE VISIT NOTE  History:   Sherry [name] is a 34 y.o. G2P1011 here today for abnormal vaginal discharge, worried about recurrent vaginitis. Had about three yeast infections documented over the last year.  Also wants something to be done about her IUD strings as her partner is complaining about them. She denies any abnormal vaginal bleeding, pelvic pain or other concerns.    Past Medical History:  Diagnosis Date  . Mild cervical dysplasia   . Postpartum care following vaginal delivery 08/08/2010  . Type 2 HSV infection of vulvovaginal region     Past Surgical History:  Procedure Laterality Date  . DILATION AND CURETTAGE, DIAGNOSTIC / THERAPEUTIC     [name], current medications, past family history, past medical history, past social history, past surgical history and problem list.   Health Maintenance:  Normal pap and negative HRHPV on 01/19/2017.     Review of Systems:  Pertinent items noted in HPI and remainder of comprehensive ROS otherwise negative.  Physical Exam:  BP 114/73   Pulse 87   Wt 214 lb 9.6 oz (97.3 kg)   BMI 31.69 kg/m  CONSTITUTIONAL: Well-developed, well-nourished female in no acute distress.  HEENT:  Normocephalic, atraumatic. External right and left ear normal. No scleral icterus.  NECK: Normal range of motion, supple, no masses noted on observation SKIN: No rash noted. Not diaphoretic. No erythema. No pallor. MUSCULOSKELETAL: Normal range of motion. No edema noted. NEUROLOGIC: Alert and oriented to person, place, and time. Normal muscle tone coordination. No cranial nerve deficit noted. PSYCHIATRIC: Normal mood and affect. Normal behavior. Normal judgment and thought content. CARDIOVASCULAR: Normal heart rate noted RESPIRATORY: Effort and breath sounds normal, no problems with respiration noted ABDOMEN: No masses noted. No other overt distention noted.     PELVIC: Normal appearing external genitalia; normal urethral meatus; normal appearing vaginal mucosa and cervix.  Yellow discharge noted, testing sample obtained.  IUD strings seen about 2 cm in length, they were pushed up into cervical canal.  Normal uterine size, no other palpable masses, no uterine or adnexal tenderness. Performed in the presence of a chaperone.     Assessment and Plan:    1. Vaginal odor - Cervicovaginal ancillary only( [place]) done, will follow up results and manage accordingly. If positive for anything, will treat and also prescribed boric acid therapy to reestablish proper vaginal pH.  Proper vulvar hygiene emphasized: discussed avoidance of perfumed soaps, detergents, lotions and any type of douches; in addition to wearing cotton underwear and no underwear at night.  Also recommended cleaning front to back, voiding and cleaning up after intercourse.   2. IUD check up Strings pushed into cervical canal.  Patient aware this may make IUD removal a little more difficult if strings go up higher.   Routine preventative health maintenance measures emphasized. Please refer to After Visit Summary for other counseling recommendations.   Return for any gynecologic concerns.    Total face-to-face time with patient: 15 minutes.  Over 50% of encounter was spent on counseling and coordination of care.   Jaynie Collins, MD, FACOG Obstetrician & Gynecologist, [facility] for [employer], [facility]

## 2019-08-03 NOTE — Patient Instructions (Signed)
Vaginitis Vaginitis is a condition in which the vaginal tissue swells and becomes red (inflamed). This condition is most often caused by a change in the normal balance of bacteria and yeast that live in the vagina. This change causes an overgrowth of certain bacteria or yeast, which causes the inflammation. There are different types of vaginitis, but the most common types are:  Bacterial vaginosis.  Yeast infection (candidiasis).  Trichomoniasis vaginitis. This is a sexually transmitted disease (STD).  Viral vaginitis.  Atrophic vaginitis.  Allergic vaginitis. What are the causes? The cause of this condition depends on the type of vaginitis. It can be caused by:  Bacteria (bacterial vaginosis).  Yeast, which is a fungus (yeast infection).  A parasite (trichomoniasis vaginitis).  A virus (viral vaginitis).  Low hormone levels (atrophic vaginitis). Low hormone levels can occur during pregnancy, breastfeeding, or after menopause.  Irritants, such as bubble baths, scented tampons, and feminine sprays (allergic vaginitis). Other factors can change the normal balance of the yeast and bacteria that live in the vagina. These include:  Antibiotic medicines.  Poor hygiene.  Diaphragms, vaginal sponges, spermicides, birth control pills, and intrauterine devices (IUD).  Sex.  Infection.  Uncontrolled diabetes.  A weakened defense (immune) system. What increases the risk? This condition is more likely to develop in women who:  Smoke.  Use vaginal douches, scented tampons, or scented sanitary pads.  Wear tight-fitting pants.  Wear thong underwear.  Use oral birth control pills or an IUD.  Have sex without a condom.  Have multiple sex partners.  Have an STD.  Frequently use the spermicide nonoxynol-9.  Eat lots of foods high in sugar.  Have uncontrolled diabetes.  Have low estrogen levels.  Have a weakened immune system from an immune disorder or medical  treatment.  Are pregnant or breastfeeding. What are the signs or symptoms? Symptoms vary depending on the cause of the vaginitis. Common symptoms include:  Abnormal vaginal discharge. ? The discharge is white, gray, or yellow with bacterial vaginosis. ? The discharge is thick, white, and cheesy with a yeast infection. ? The discharge is frothy and yellow or greenish with trichomoniasis.  A bad vaginal smell. The smell is fishy with bacterial vaginosis.  Vaginal itching, pain, or swelling.  Sex that is painful.  Pain or burning when urinating. Sometimes there are no symptoms. How is this diagnosed? This condition is diagnosed based on your symptoms and medical history. A physical exam, including a pelvic exam, will also be done. You may also have other tests, including:  Tests to determine the pH level (acidity or alkalinity) of your vagina.  A whiff test, to assess the odor that results when a sample of your vaginal discharge is mixed with a potassium hydroxide solution.  Tests of vaginal fluid. A sample will be examined under a microscope. How is this treated? Treatment varies depending on the type of vaginitis you have. Your treatment may include:  Antibiotic creams or pills to treat bacterial vaginosis and trichomoniasis.  Antifungal medicines, such as vaginal creams or suppositories, to treat a yeast infection.  Medicine to ease discomfort if you have viral vaginitis. Your sexual partner should also be treated.  Estrogen delivered in a cream, pill, suppository, or vaginal ring to treat atrophic vaginitis. If vaginal dryness occurs, lubricants and moisturizing creams may help. You may need to avoid scented soaps, sprays, or douches.  Stopping use of a product that is causing allergic vaginitis. Then using a vaginal cream to treat the symptoms. Follow   these instructions at home: Lifestyle  Keep your genital area clean and dry. Avoid soap, and only rinse the area with  water.  Do not douche or use tampons until your health care provider says it is okay to do so. Use sanitary pads, if needed.  Do not have sex until your health care provider approves. When you can return to sex, practice safe sex and use condoms.  Wipe from front to back. This avoids the spread of bacteria from the rectum to the vagina. General instructions  Take over-the-counter and prescription medicines only as told by your health care provider.  If you were prescribed an antibiotic medicine, take or use it as told by your health care provider. Do not stop taking or using the antibiotic even if you start to feel better.  Keep all follow-up visits as told by your health care provider. This is important. How is this prevented?  Use mild, non-scented products. Do not use things that can irritate the vagina, such as fabric softeners. Avoid the following products if they are scented: ? Feminine sprays. ? Detergents. ? Tampons. ? Feminine hygiene products. ? Soaps or bubble baths.  Let air reach your genital area. ? Wear cotton underwear to reduce moisture buildup. ? Avoid wearing underwear while you sleep. ? Avoid wearing tight pants and underwear or nylons without a cotton panel. ? Avoid wearing thong underwear.  Take off any wet clothing, such as bathing suits, as soon as possible.  Practice safe sex and use condoms. Contact a health care provider if:  You have abdominal pain.  You have a fever.  You have symptoms that last for more than 2-3 days. Get help right away if:  You have a fever and your symptoms suddenly get worse. Summary  Vaginitis is a condition in which the vaginal tissue becomes inflamed.This condition is most often caused by a change in the normal balance of bacteria and yeast that live in the vagina.  Treatment varies depending on the type of vaginitis you have.  Do not douche, use tampons , or have sex until your health care provider approves. When  you can return to sex, practice safe sex and use condoms. This information is not intended to replace advice given to you by your health care provider. Make sure you discuss any questions you have with your health care provider. Document Revised: 12/12/2016 Document Reviewed: 02/05/2016 Elsevier Patient Education  2020 [employer]  

## 2019-08-05 LAB — CERVICOVAGINAL ANCILLARY ONLY
Bacterial Vaginitis (gardnerella): POSITIVE — AB
Candida Glabrata: NEGATIVE
Candida Vaginitis: NEGATIVE
Chlamydia: NEGATIVE
Comment: NEGATIVE
Comment: NEGATIVE
Comment: NEGATIVE
Comment: NEGATIVE
Comment: NEGATIVE
Comment: NORMAL
Neisseria Gonorrhea: NEGATIVE
Trichomonas: NEGATIVE

## 2019-08-06 MED ORDER — METRONIDAZOLE 500 MG PO TABS: 500.0000 mg | ORAL_TABLET | Freq: Two times a day (BID) | ORAL | 1 refills | Status: AC

## 2019-08-06 NOTE — Addendum Note (Signed)
Addended by: Jaynie Collins A on: 08/06/2019 10:01 AM   Modules accepted: Orders

## 2019-09-29 ENCOUNTER — Other Ambulatory Visit: Payer: Self-pay

## 2019-09-29 DIAGNOSIS — B379 Candidiasis, unspecified: Secondary | ICD-10-CM

## 2019-09-29 MED ORDER — FLUCONAZOLE 150 MG PO TABS: 150.0000 mg | ORAL_TABLET | Freq: Once | ORAL | 0 refills | Status: AC

## 2019-09-29 NOTE — Progress Notes (Signed)
Pt states is having symptoms of yeast infection, irritation, thick d/c. Sent Diflucan for Tx, if does not help will have pt to come in & do swab.

## 2019-10-11 ENCOUNTER — Encounter: Payer: Self-pay | Admitting: Nurse Practitioner

## 2019-10-11 ENCOUNTER — Ambulatory Visit (INDEPENDENT_AMBULATORY_CARE_PROVIDER_SITE_OTHER): Payer: BC Managed Care – PPO | Admitting: Nurse Practitioner

## 2019-10-11 ENCOUNTER — Other Ambulatory Visit: Payer: Self-pay

## 2019-10-11 ENCOUNTER — Other Ambulatory Visit (HOSPITAL_COMMUNITY)
Admission: RE | Admit: 2019-10-11 | Discharge: 2019-10-11 | Disposition: A | Discharge status: Home / Self Care | Payer: BC Managed Care – PPO | Source: Ambulatory Visit | Attending: Nurse Practitioner | Admitting: Nurse Practitioner

## 2019-10-11 VITALS — BP 111/71 | HR 72 | Ht 70.0 in | Wt 223.3 lb

## 2019-10-11 DIAGNOSIS — N760 Acute vaginitis: Secondary | ICD-10-CM | POA: Diagnosis not present

## 2019-10-11 DIAGNOSIS — Z975 Presence of (intrauterine) contraceptive device: Secondary | ICD-10-CM

## 2019-10-11 DIAGNOSIS — N898 Other specified noninflammatory disorders of vagina: Secondary | ICD-10-CM

## 2019-10-11 DIAGNOSIS — Z3009 Encounter for other general counseling and advice on contraception: Secondary | ICD-10-CM | POA: Diagnosis not present

## 2019-10-11 DIAGNOSIS — B9689 Other specified bacterial agents as the cause of diseases classified elsewhere: Secondary | ICD-10-CM | POA: Diagnosis not present

## 2019-10-11 NOTE — Progress Notes (Signed)
   GYNECOLOGY OFFICE VISIT NOTE   History:  34 y.o. G2P1011 here today for removal of her IUD. She thinks she has BV due to odor and is thinking the IUD is causing BV.  She denies any abnormal  bleeding, pelvic pain or other concerns.   Past Medical History:  Diagnosis Date  . Mild cervical dysplasia   . Postpartum care following vaginal delivery 08/08/2010  . Type 2 HSV infection of vulvovaginal region     Past Surgical History:  Procedure Laterality Date  . DILATION AND CURETTAGE, DIAGNOSTIC / THERAPEUTIC      [name], current medications, past family history, past medical history, past social history, past surgical history and problem list.   Health Maintenance:  Normal pap on 01-2017.   Review of Systems:  Pertinent items noted in HPI and remainder of comprehensive ROS otherwise negative.  Objective:  Physical Exam BP 111/71   Pulse 72   Ht 5\' 10"  (1.778 m)   Wt 223 lb 4.8 oz (101.3 kg)   BMI 32.04 kg/m  CONSTITUTIONAL: Well-developed, well-nourished female in no acute distress.  HENT:  Normocephalic, atraumatic. External right and left ear normal.  EYES: Conjunctivae and EOM are normal. Pupils are equal, round.  No scleral icterus.  NECK: Normal range of motion, supple, SKIN: Skin is warm and dry. No rash noted. Not diaphoretic. No erythema. No pallor. NEUROLOGIC: Alert and oriented to person, place, and time. Normal muscle tone coordination. No cranial nerve deficit noted. PSYCHIATRIC: Normal mood and affect. Normal behavior. Normal judgment and thought content. CARDIOVASCULAR: Normal heart rate noted RESPIRATORY: Effort and breath sounds normal, no problems with respiration noted ABDOMEN: Soft, no distention noted.   PELVIC: Normal appearing external genitalia; normal appearing vaginal mucosa and cervix.  No abnormal discharge noted.  Normal uterine size, no other palpable  masses, no uterine or adnexal tenderness. IUD strings seen in the cervix.  Small amount of discharge, no odor detected. MUSCULOSKELETAL: Normal range of motion. No edema noted.  Labs and Imaging No results found.  Assessment & Plan:  1. Presence of IUD Client decided to keep IUD for now.  No longer having problems with partner feeling strings.  Discussed a possibility of a pregnancy if IUD is removed and no other form of contraception is being used.  Decided to do some reading on contraception - considering Nuvaring or Nexplanon (had not heard of nexplanon previously)  2. Vaginal discharge Has been using summer's eve to make sure there is no odor Advised to stop the cycle of trying to get rid of odor Client is not always aware of what is being treated.  States she was given a one time dose of a small pill for BV.  Likely that was Diflucan given for yeast. Advised that with IUD strings   - Cervicovaginal ancillary only( [place])   Routine preventative health maintenance measures emphasized. Please refer to After Visit Summary for other counseling recommendations.   Return in about 3 months (around 01/10/2020) for  annual visit and review contraceptive choices.   Total face-to-face time with patient: 10 minutes.  Over 50% of encounter was spent on counseling and coordination of care.  01/12/2020, RN, MSN, NP-BC Nurse Practitioner, [facility] for [facility] 10/11/2019 5:15 PM

## 2019-10-12 LAB — CERVICOVAGINAL ANCILLARY ONLY
Bacterial Vaginitis (gardnerella): POSITIVE — AB
Candida Glabrata: NEGATIVE
Candida Vaginitis: NEGATIVE
Chlamydia: NEGATIVE
Comment: NEGATIVE
Comment: NEGATIVE
Comment: NEGATIVE
Comment: NEGATIVE
Comment: NORMAL
Neisseria Gonorrhea: NEGATIVE

## 2019-10-12 MED ORDER — METRONIDAZOLE 0.75 % VA GEL: 1.0000 | Freq: Every day | VAGINAL | 0 refills | Status: AC

## 2019-10-12 NOTE — Addendum Note (Signed)
Addended by: Currie Paris on: 10/12/2019 08:00 PM   Modules accepted: Orders

## 2019-10-18 ENCOUNTER — Telehealth (INDEPENDENT_AMBULATORY_CARE_PROVIDER_SITE_OTHER): Payer: BC Managed Care – PPO | Admitting: Lactation Services

## 2019-10-18 DIAGNOSIS — N760 Acute vaginitis: Secondary | ICD-10-CM

## 2019-10-18 DIAGNOSIS — B9689 Other specified bacterial agents as the cause of diseases classified elsewhere: Secondary | ICD-10-CM

## 2019-10-18 NOTE — Telephone Encounter (Signed)
Patient had not read message about BV on My Chart. Called to see if she is aware that her testing is + for BV. She reports she picked up the medication yesterday.

## 2019-11-09 ENCOUNTER — Ambulatory Visit: Payer: BC Managed Care – PPO | Admitting: Nurse Practitioner

## 2019-12-20 ENCOUNTER — Other Ambulatory Visit (HOSPITAL_BASED_OUTPATIENT_CLINIC_OR_DEPARTMENT_OTHER): Payer: Self-pay | Admitting: Internal Medicine

## 2019-12-20 ENCOUNTER — Ambulatory Visit: Payer: BC Managed Care – PPO | Attending: Internal Medicine

## 2019-12-20 DIAGNOSIS — Z23 Encounter for immunization: Secondary | ICD-10-CM

## 2019-12-20 NOTE — Progress Notes (Signed)
   Covid-19 Vaccination Clinic  Name:  James Lafalce    MRN: [id number] DOB: 31-Aug-1985  12/20/2019  [name] was observed post Covid-19 immunization for 15 minutes without incident. She was provided with Vaccine Information Sheet and instruction to access the V-Safe system.   [name] was instructed to call 911 with any severe reactions post vaccine: Marland Kitchen Difficulty breathing  . Swelling of face and throat  . A fast heartbeat  . A bad rash all over body  . Dizziness and weakness   Immunizations Administered    Name Date Dose VIS Date Route   Pfizer COVID-19 Vaccine 12/20/2019  1:43 PM 0.3 mL 11/02/2019 Intramuscular   Manufacturer: [employer], [employer]   Lot: [id number]   NDC: [address]-3

## 2020-01-18 ENCOUNTER — Ambulatory Visit: Payer: BC Managed Care – PPO | Admitting: Certified Nurse Midwife

## 2020-02-06 ENCOUNTER — Other Ambulatory Visit: Payer: Self-pay

## 2020-02-06 DIAGNOSIS — N898 Other specified noninflammatory disorders of vagina: Secondary | ICD-10-CM

## 2020-02-06 MED ORDER — METRONIDAZOLE 0.75 % VA GEL: 1.0000 | Freq: Two times a day (BID) | VAGINAL | 0 refills | Status: DC

## 2020-02-06 MED ORDER — METRONIDAZOLE 0.75 % VA GEL: 1.0000 | Freq: Two times a day (BID) | VAGINAL | Status: DC

## 2020-02-06 NOTE — Progress Notes (Signed)
Pt called with symptoms of vaginal itching, and discharge.Rx Metrogel for Tx.

## 2020-04-17 ENCOUNTER — Other Ambulatory Visit: Payer: Self-pay

## 2020-04-24 ENCOUNTER — Ambulatory Visit: Payer: BC Managed Care – PPO | Admitting: Obstetrics and Gynecology

## 2020-05-03 ENCOUNTER — Ambulatory Visit (INDEPENDENT_AMBULATORY_CARE_PROVIDER_SITE_OTHER): Payer: BC Managed Care – PPO | Admitting: Nurse Practitioner

## 2020-05-03 ENCOUNTER — Other Ambulatory Visit: Payer: Self-pay

## 2020-05-03 ENCOUNTER — Encounter: Payer: Self-pay | Admitting: Nurse Practitioner

## 2020-05-03 VITALS — BP 118/76 | HR 83 | Ht 70.0 in | Wt 233.8 lb

## 2020-05-03 DIAGNOSIS — Z760 Encounter for issue of repeat prescription: Secondary | ICD-10-CM

## 2020-05-03 MED ORDER — VALACYCLOVIR HCL 500 MG PO TABS: 500.0000 mg | ORAL_TABLET | Freq: Two times a day (BID) | ORAL | 2 refills | Status: DC

## 2020-05-03 NOTE — Progress Notes (Signed)
   GYNECOLOGY OFFICE VISIT NOTE   History:  35 y.o. G2P1011 here today for pill refill for Valtrex.  She is due for an annual exam but does not want to have that today.  Ran out of Valtrex a few weeks ago when she had a HSV outbreak.  Has outbreaks 2-3 times a year and treats just when she has an outbreak.  She denies any abnormal vaginal discharge, bleeding, pelvic pain or other concerns.   Past Medical History:  Diagnosis Date  . Mild cervical dysplasia   . Postpartum care following vaginal delivery 08/08/2010  . Type 2 HSV infection of vulvovaginal region     Past Surgical History:  Procedure Laterality Date  . DILATION AND CURETTAGE, DIAGNOSTIC / THERAPEUTIC      [name], current medications, past family history, past medical history, past social history, past surgical history and problem list.   Health Maintenance:  Normal pap on 01-29-17.    Review of Systems:  Pertinent items noted in HPI and remainder of comprehensive ROS otherwise negative.  Objective:  Physical Exam BP 118/76   Pulse 83   Ht 5\' 10"  (1.778 m)   Wt 233 lb 12.8 oz (106.1 kg)   BMI 33.55 kg/m  CONSTITUTIONAL: Well-developed, well-nourished female in no acute distress.  HENT:  Normocephalic, atraumatic. External right and left ear normal.  EYES: Conjunctivae and EOM are normal. Pupils are equal, round.  No scleral icterus.  NECK: Normal range of motion, supple, no masses SKIN: Skin is warm and dry. No rash noted. Not diaphoretic. No erythema. No pallor. NEUROLOGIC: Alert and oriented to person, place, and time. Normal muscle tone coordination. No cranial nerve deficit noted. PSYCHIATRIC: Normal mood and affect. Normal behavior. Normal judgment and thought content. CARDIOVASCULAR: Normal heart rate noted RESPIRATORY: Effort and breath sounds normal, no problems with respiration noted ABDOMEN: Soft, no distention  noted.   PELVIC: Deferred MUSCULOSKELETAL: Normal range of motion. No edema noted.  Labs and Imaging No results found.  Assessment & Plan:  1.  Encounter for medication refill Needs annual exam but declines that today.  Will return within one month for annual exam Has her child accompanying her today  Routine preventative health maintenance measures emphasized. Please refer to After Visit Summary for other counseling recommendations.   Return in about 1 month (around 06/02/2020) for annual exam.   Total face-to-face time with patient: 10 minutes.  Over 50% of encounter was spent on counseling and coordination of care.  06/04/2020, RN, MSN, NP-BC Nurse Practitioner, [facility] for [facility] 05/03/2020 9:39 AM

## 2020-06-04 ENCOUNTER — Ambulatory Visit: Payer: BC Managed Care – PPO | Admitting: Nurse Practitioner

## 2020-06-07 ENCOUNTER — Other Ambulatory Visit: Payer: Self-pay

## 2020-06-07 ENCOUNTER — Ambulatory Visit (INDEPENDENT_AMBULATORY_CARE_PROVIDER_SITE_OTHER): Payer: BC Managed Care – PPO | Admitting: Obstetrics and Gynecology

## 2020-06-07 ENCOUNTER — Other Ambulatory Visit (HOSPITAL_COMMUNITY): Payer: Self-pay

## 2020-06-07 ENCOUNTER — Encounter: Payer: Self-pay | Admitting: Obstetrics and Gynecology

## 2020-06-07 ENCOUNTER — Other Ambulatory Visit (HOSPITAL_COMMUNITY)
Admission: RE | Admit: 2020-06-07 | Discharge: 2020-06-07 | Disposition: A | Discharge status: Home / Self Care | Payer: BC Managed Care – PPO | Source: Ambulatory Visit | Attending: Obstetrics and Gynecology | Admitting: Obstetrics and Gynecology

## 2020-06-07 VITALS — BP 138/94 | HR 106 | Temp 98.5°F | Ht 70.0 in | Wt 226.4 lb

## 2020-06-07 DIAGNOSIS — Z01419 Encounter for gynecological examination (general) (routine) without abnormal findings: Secondary | ICD-10-CM | POA: Insufficient documentation

## 2020-06-07 DIAGNOSIS — Z3040 Encounter for surveillance of contraceptives, unspecified: Secondary | ICD-10-CM | POA: Insufficient documentation

## 2020-06-07 DIAGNOSIS — B009 Herpesviral infection, unspecified: Secondary | ICD-10-CM

## 2020-06-07 DIAGNOSIS — Z113 Encounter for screening for infections with a predominantly sexual mode of transmission: Secondary | ICD-10-CM | POA: Diagnosis not present

## 2020-06-07 MED ORDER — VALACYCLOVIR HCL 500 MG PO TABS
500.0000 mg | ORAL_TABLET | Freq: Two times a day (BID) | ORAL | 11 refills | Status: DC
  Filled 2020-06-07: qty 6, 3d supply, fill #0
  Filled 2020-07-13: qty 6, 3d supply, fill #1
  Filled 2020-07-31: qty 6, 3d supply, fill #2
  Filled 2020-08-16: qty 6, 3d supply, fill #3
  Filled 2020-10-20: qty 6, 3d supply, fill #4
  Filled 2020-10-25: qty 6, 3d supply, fill #5
  Filled 2020-11-01: qty 6, 3d supply, fill #6
  Filled 2020-11-22: qty 6, 3d supply, fill #7
  Filled 2020-12-13: qty 6, 3d supply, fill #8
  Filled 2020-12-29: qty 6, 3d supply, fill #9

## 2020-06-07 NOTE — Addendum Note (Signed)
Addended by: Kenard Gower on: 06/07/2020 03:19 PM   Modules accepted: Orders

## 2020-06-07 NOTE — Progress Notes (Addendum)
WELL-WOMAN PHYSICAL & PAP Patient name: Sherry [name] MRN [id number]  Date of birth: Aug 25, 1985 Chief Complaint:   Gynecologic Exam  History of Present Illness:   Sherry [name] is a 35 y.o. G29P1011 African American female being seen today for a routine well-woman exam.  Current complaints: unsure if wants IUD removed d/t considering TTC with partner, but has since discovered partner is not monogamous. She is requesting STI testing.  PCP: none      does desire labs Patient's last menstrual period was 05/24/2020 (approximate). The current method of family planning is IUD.  Last pap 01/19/2017. Results were: normal Last mammogram: n/a. Results were: n/a. Family h/o breast cancer: No Last colonoscopy: n/a. Results were: n/a. Family h/o colorectal cancer: Yes Review of Systems:   Pertinent items are noted in HPI Denies any headaches, blurred vision, fatigue, shortness of breath, chest pain, abdominal pain, abnormal vaginal discharge/itching/odor/irritation, problems with periods, bowel movements, urination, or intercourse unless otherwise stated above. Pertinent History Reviewed:  Reviewed past medical,surgical, social and family history.  Reviewed problem list, medications and allergies. Physical Assessment:   Vitals:   06/07/20 1031  BP: (!) 138/94  Pulse: (!) 106  Temp: 98.5 F (36.9 C)  TempSrc: Oral  Weight: 226 lb 6.4 oz (102.7 kg)  Height: 5\' 10"  (1.778 m)  Body mass index is 32.49 kg/m.        Physical Examination:   General appearance - well appearing, and in no distress  Mental status - alert, oriented to person, place, and time  Psych:  She has a normal mood and affect  Skin - warm and dry, normal color, no suspicious lesions noted  Chest - effort normal, all lung fields clear to auscultation bilaterally  Heart - normal rate and regular rhythm  Neck:  midline trachea, no thyromegaly or nodules  Breasts - breasts appear normal, no suspicious masses, no skin or nipple  changes or  axillary nodes  Abdomen - soft, nontender, nondistended, no masses or organomegaly  Pelvic - VULVA: normal appearing vulva with no masses, tenderness or lesions  VAGINA: normal appearing vagina with normal color and discharge, no lesions  CERVIX: normal appearing cervix without discharge or lesions, no CMT  Thin prep pap is done with HR HPV cotesting  UTERUS: uterus is felt to be normal size, shape, consistency and nontender   ADNEXA: No adnexal masses or tenderness noted.  Rectal - deferred  Extremities:  No swelling or varicosities noted  No results found for this or any previous visit (from the past 24 hour(s)).  Assessment & Plan:  1) Well-Woman Exam with Pap  2) Encounter for routine gynecological examination with Papanicolaou smear of cervix - Cytology - PAP( [facility]),  - Cervicovaginal ancillary only( [place]),  - RPR+HBsAg+HCVAb+...  3) Screen for STD (sexually transmitted disease)  - Cervicovaginal ancillary only( [place]),  - RPR+HBsAg+HCVAb+...  4) Encounter for surveillance of contraceptive device - Advised since partner is not monogamous she should reconsider not having IUD removed today. - Patient in agreement and decides to continue with IUD - Will call to schedule IUD removal, if she changes her mind  5) HSV infection  -Rx refill for valACYclovir (VALTREX) 500 MG tablet   Labs/procedures today: Pap, Wet Prep and STI testing  Mammogram at age 89 or sooner if problems Colonoscopy at age 5 or sooner if problems  Orders Placed This Encounter  Procedures  . RPR+HBsAg+HCVAb+...    Meds:  Meds ordered this encounter  Medications  .  valACYclovir (VALTREX) 500 MG tablet    Sig: Take 1 tablet (500 mg total) by mouth 2 (two) times daily.    Dispense:  6 tablet    Refill:  11    Order Specific Question:   Supervising Provider    Answer:   Reva Bores [[id number]]    Follow-up: Return in about 1 year (around 06/07/2021) for Annual  Exam.  Raelyn Mora MSN, CNM 06/07/2020 10:53 AM

## 2020-06-08 LAB — CERVICOVAGINAL ANCILLARY ONLY
Bacterial Vaginitis (gardnerella): POSITIVE — AB
Candida Glabrata: NEGATIVE
Candida Vaginitis: NEGATIVE
Chlamydia: NEGATIVE
Comment: NEGATIVE
Comment: NEGATIVE
Comment: NEGATIVE
Comment: NEGATIVE
Comment: NEGATIVE
Comment: NORMAL
Neisseria Gonorrhea: NEGATIVE
Trichomonas: NEGATIVE

## 2020-06-08 LAB — RPR+HBSAG+HCVAB+...
HIV Screen 4th Generation wRfx: NONREACTIVE
Hep C Virus Ab: <0.1 s/co ratio (ref 0.0–0.9)
Hepatitis B Surface Ag: NEGATIVE
RPR Ser Ql: NONREACTIVE

## 2020-06-09 ENCOUNTER — Other Ambulatory Visit: Payer: Self-pay | Admitting: Obstetrics and Gynecology

## 2020-06-09 DIAGNOSIS — B9689 Other specified bacterial agents as the cause of diseases classified elsewhere: Secondary | ICD-10-CM

## 2020-06-09 DIAGNOSIS — N760 Acute vaginitis: Secondary | ICD-10-CM

## 2020-06-09 MED ORDER — METRONIDAZOLE 500 MG PO TABS: 500.0000 mg | ORAL_TABLET | Freq: Two times a day (BID) | ORAL | 0 refills | Status: DC

## 2020-06-12 ENCOUNTER — Other Ambulatory Visit (HOSPITAL_COMMUNITY): Payer: Self-pay

## 2020-06-12 ENCOUNTER — Other Ambulatory Visit: Payer: Self-pay | Admitting: *Deleted

## 2020-06-12 DIAGNOSIS — N760 Acute vaginitis: Secondary | ICD-10-CM

## 2020-06-12 DIAGNOSIS — B9689 Other specified bacterial agents as the cause of diseases classified elsewhere: Secondary | ICD-10-CM

## 2020-06-12 LAB — CYTOLOGY - PAP
Comment: NEGATIVE
Diagnosis: NEGATIVE
High risk HPV: NEGATIVE

## 2020-06-12 MED ORDER — METRONIDAZOLE 500 MG PO TABS
500.0000 mg | ORAL_TABLET | Freq: Two times a day (BID) | ORAL | 0 refills | Status: DC
  Filled 2020-06-12 – 2020-06-13 (×2): qty 14, 7d supply, fill #0

## 2020-06-12 NOTE — Progress Notes (Signed)
Medication sent to wrong pharmacy.  Clovis Pu, RN

## 2020-06-13 ENCOUNTER — Other Ambulatory Visit (HOSPITAL_COMMUNITY): Payer: Self-pay

## 2020-06-28 ENCOUNTER — Other Ambulatory Visit: Payer: Self-pay

## 2020-06-28 ENCOUNTER — Ambulatory Visit (INDEPENDENT_AMBULATORY_CARE_PROVIDER_SITE_OTHER): Payer: BC Managed Care – PPO | Admitting: Obstetrics and Gynecology

## 2020-06-28 ENCOUNTER — Encounter: Payer: Self-pay | Admitting: Obstetrics and Gynecology

## 2020-06-28 VITALS — BP 117/78 | HR 0 | Ht 70.0 in | Wt 223.0 lb

## 2020-06-28 DIAGNOSIS — Z3040 Encounter for surveillance of contraceptives, unspecified: Secondary | ICD-10-CM | POA: Diagnosis not present

## 2020-06-28 DIAGNOSIS — Z30432 Encounter for removal of intrauterine contraceptive device: Secondary | ICD-10-CM | POA: Diagnosis not present

## 2020-06-28 NOTE — Progress Notes (Signed)
   IUD Removal Procedure Note   Patient is 35 y.o. G2P1011 who is here for Liletta IUD removal. She would like IUD removed secondary to patient preference. She has had no issues with IUD. She understands that she could get pregnant after removal of IUD if she does not use another form of contraception. She has no other complaints today. Reviewed risks of removal including pain, bleeding, difficult removal and inability to remove IUD which may require surgical removal in OR. She affirms that she would like IUD removed.  BP 117/78   Pulse (!) 0   Ht 5\' 10"  (1.778 m)   Wt 223 lb (101.2 kg)   LMP 06/25/2020   BMI 32.00 kg/m   Patient with normal appearing external female genitalia. Graves speculum placed in vagina and Liletta IUD strings easily visualized. Strings grasped with ring forceps and removed easily. Minimal bleeding noted. All instruments removed from vagina. Patient tolerated procedure very well.    She was given post removal instructions. She is planning on using abstinence and condoms for contraception.   Return 1 year for annual or prn.  Total face-to-face time of 10 minutes was spent signing consent with patient, explaining procedure, prepping patient for procedure and discussing other birth control options.  06/27/2020, MSN, CNM 06/28/2020 3:25 PM

## 2020-07-10 ENCOUNTER — Other Ambulatory Visit: Payer: Self-pay | Admitting: Obstetrics and Gynecology

## 2020-07-10 ENCOUNTER — Other Ambulatory Visit (HOSPITAL_COMMUNITY): Payer: Self-pay

## 2020-07-10 DIAGNOSIS — B9689 Other specified bacterial agents as the cause of diseases classified elsewhere: Secondary | ICD-10-CM

## 2020-07-11 ENCOUNTER — Other Ambulatory Visit (HOSPITAL_COMMUNITY): Payer: Self-pay

## 2020-07-11 MED ORDER — METRONIDAZOLE 500 MG PO TABS
500.0000 mg | ORAL_TABLET | Freq: Two times a day (BID) | ORAL | 0 refills | Status: DC
  Filled 2020-07-11: qty 14, 7d supply, fill #0

## 2020-07-13 ENCOUNTER — Other Ambulatory Visit (HOSPITAL_COMMUNITY): Payer: Self-pay

## 2020-07-31 ENCOUNTER — Other Ambulatory Visit (HOSPITAL_COMMUNITY): Payer: Self-pay

## 2020-08-03 ENCOUNTER — Ambulatory Visit: Payer: BC Managed Care – PPO

## 2020-08-16 ENCOUNTER — Other Ambulatory Visit (HOSPITAL_COMMUNITY): Payer: Self-pay

## 2020-09-28 ENCOUNTER — Other Ambulatory Visit (HOSPITAL_COMMUNITY): Payer: Self-pay

## 2020-10-20 ENCOUNTER — Other Ambulatory Visit (HOSPITAL_COMMUNITY): Payer: Self-pay

## 2020-10-25 ENCOUNTER — Other Ambulatory Visit (HOSPITAL_COMMUNITY): Payer: Self-pay

## 2020-11-01 ENCOUNTER — Other Ambulatory Visit (HOSPITAL_COMMUNITY): Payer: Self-pay

## 2020-11-09 ENCOUNTER — Other Ambulatory Visit (HOSPITAL_COMMUNITY): Payer: Self-pay

## 2020-11-15 ENCOUNTER — Telehealth: Payer: BC Managed Care – PPO | Admitting: Physician Assistant

## 2020-11-15 DIAGNOSIS — J020 Streptococcal pharyngitis: Secondary | ICD-10-CM | POA: Diagnosis not present

## 2020-11-15 MED ORDER — AMOXICILLIN 500 MG PO CAPS: 500.0000 mg | ORAL_CAPSULE | Freq: Two times a day (BID) | ORAL | 0 refills | Status: AC

## 2020-11-15 NOTE — Patient Instructions (Signed)
Sherry [name], thank you for joining Margaretann Loveless, PA-C for today's virtual visit.  While this provider is not your primary care provider (PCP), if your PCP is located in our provider database this encounter information will be shared with them immediately following your visit.  Consent: (Patient) Sherry [name] provided verbal consent for this virtual visit at the beginning of the encounter.  Current Medications:  Current Outpatient Medications:    amoxicillin (AMOXIL) 500 MG capsule, Take 1 capsule (500 mg total) by mouth 2 (two) times daily for 10 days., Disp: 20 capsule, Rfl: 0   acetaminophen (TYLENOL) 325 MG tablet, Take 650 mg by mouth every 6 (six) hours as needed for moderate pain. (Patient not taking: No sig reported), Disp: , Rfl:    COVID-19 mRNA vaccine, Pfizer, 30 MCG/0.3ML injection, INJECT AS DIRECTED (Patient not taking: No sig reported), Disp: .3 mL, Rfl: 0   ibuprofen (ADVIL,MOTRIN) 600 MG tablet, Take 1 tablet (600 mg total) by mouth every 6 (six) hours as needed. (Patient not taking: No sig reported), Disp: 30 tablet, Rfl: 0   metroNIDAZOLE (FLAGYL) 500 MG tablet, Take 1 tablet by mouth 2 times daily--do not drink alcohol while on this medication, Disp: 14 tablet, Rfl: 0   valACYclovir (VALTREX) 500 MG tablet, Take 1 tablet (500 mg total) by mouth 2 (two) times daily. (Patient not taking: Reported on 06/28/2020), Disp: 6 tablet, Rfl: 11   Medications ordered in this encounter:  Meds ordered this encounter  Medications   amoxicillin (AMOXIL) 500 MG capsule    Sig: Take 1 capsule (500 mg total) by mouth 2 (two) times daily for 10 days.    Dispense:  20 capsule    Refill:  0    Order Specific Question:   Supervising Provider    Answer:   Hyacinth Meeker, BRIAN [[id number]]     *If you need refills on other medications prior to your next appointment, please contact your pharmacy*  Follow-Up: Call back or seek an in-person evaluation if the symptoms worsen or if the condition fails  to improve as anticipated.  Other Instructions Strep Throat, Adult Strep throat is an infection of the throat. It is caused by germs (bacteria). Strep throat is common during the cold months of the year. It mostly affects children who are 68-12 years old. However, people of all ages can get it at any time of the year. This infection spreads from person to person through coughing, sneezing, or having close contact. What are the causes? This condition is caused by the Streptococcus pyogenes germ. What increases the risk? You care for young children. Children are more likely to get strep throat and may spread it to others. You go to crowded places. Germs can spread easily in such places. You kiss or touch someone who has strep throat. What are the signs or symptoms? Fever or chills. Redness, swelling, or pain in the tonsils or throat. Pain or trouble when swallowing. White or yellow spots on the tonsils or throat. Tender glands in the neck and under the jaw. Bad breath. Red rash all over the body. This is rare. How is this treated? Medicines that kill germs (antibiotics). Medicines that treat pain or fever. These include: Ibuprofen or acetaminophen. Aspirin, only for people who are over the age of 54. Cough drops. Throat sprays. Follow these instructions at home: Medicines  Take over-the-counter and prescription medicines only as told by your doctor. Take your antibiotic medicine as told by your doctor. Do not stop taking  the antibiotic even if you start to feel better. Eating and drinking  If you have trouble swallowing, eat soft foods until your throat feels better. Drink enough fluid to keep your pee (urine) pale yellow. To help with pain, you may have: Warm fluids, such as soup and tea. Cold fluids, such as frozen desserts or popsicles. General instructions Rinse your mouth (gargle) with a salt-water mixture 3-4 times a day or as needed. To make a salt-water mixture, dissolve  -1 tsp (3-6 g) of salt in 1 cup (237 mL) of warm water. Rest as much as you can. Stay home from work or school until you have been taking antibiotics for 24 hours. Do not smoke or use any products that contain nicotine or tobacco. If you need help quitting, ask your doctor. Keep all follow-up visits. How is this prevented?  Do not share food, drinking cups, or personal items. They can cause the germs to spread. Wash your hands well with soap and water. Make sure that all people in your house wash their hands well. Have family members tested if they have a fever or a sore throat. They may need an antibiotic if they have strep throat. Contact a doctor if: You have swelling in your neck that keeps getting bigger. You get a rash, cough, or earache. You cough up a thick fluid that is green, yellow-brown, or bloody. You have pain that does not get better with medicine. Your symptoms get worse instead of getting better. You have a fever. Get help right away if: You vomit. You have a very bad headache. Your neck hurts or feels stiff. You have chest pain or are short of breath. You have drooling, very bad throat pain, or changes in your voice. Your neck is swollen, or the skin gets red and tender. Your mouth is dry, or you are peeing less than normal. You keep feeling more tired or have trouble waking up. Your joints are red or painful. These symptoms may be an emergency. Do not wait to see if the symptoms will go away. Get help right away. Call your local emergency services (911 in the [place] Summary Strep throat is an infection of the throat. It is caused by germs (bacteria). This infection can spread from person to person through coughing, sneezing, or having close contact. Take your medicines, including antibiotics, as told by your doctor. Do not stop taking the antibiotic even if you start to feel better. To prevent the spread of germs, wash your hands well with soap and water. Have  others do the same. Do not share food, drinking cups, or personal items. Get help right away if you have a bad headache, chest pain, shortness of breath, a stiff or painful neck, or you vomit. This information is not intended to replace advice given to you by your health care provider. Make sure you discuss any questions you have with your health care provider. Document Revised: 04/24/2020 Document Reviewed: 04/24/2020 Elsevier Patient Education  2022 [employer].    If you have been instructed to have an in-person evaluation today at a local Urgent Care facility, please use the link below. It will take you to a list of all of our available [facility], including address, phone number and hours of operation. Please do not delay care.  [facility]  If you or a family member do not have a primary care provider, use the link below to schedule a visit and establish care. When  you choose a [place] primary care physician or advanced practice provider, you gain a long-term partner in health. Find a Primary Care Provider  Learn more about [facility]'s in-office and virtual care options: [facility]

## 2020-11-15 NOTE — Progress Notes (Signed)
Virtual Visit Consent   Sherry [name], you are scheduled for a virtual visit with a [place] provider today.     Just as with appointments in the office, your consent must be obtained to participate.  Your consent will be active for this visit and any virtual visit you may have with one of our providers in the next 365 days.     If you have a MyChart account, a copy of this consent can be sent to you electronically.  All virtual visits are billed to your insurance company just like a traditional visit in the office.    As this is a virtual visit, video technology does not allow for your provider to perform a traditional examination.  This may limit your provider's ability to fully assess your condition.  If your provider identifies any concerns that need to be evaluated in person or the need to arrange testing (such as labs, EKG, etc.), we will make arrangements to do so.     Although advances in technology are sophisticated, we cannot ensure that it will always work on either your end or our end.  If the connection with a video visit is poor, the visit may have to be switched to a telephone visit.  With either a video or telephone visit, we are not always able to ensure that we have a secure connection.     I need to obtain your verbal consent now.   Are you willing to proceed with your visit today?    Sherry [name] has provided verbal consent on 11/15/2020 for a virtual visit (video or telephone).   Margaretann Loveless, PA-C   Date: 11/15/2020 9:15 AM   Virtual Visit via Video Note   I, Margaretann Loveless, connected with  Sherry [name]  ([id number], 35-Mar-1987) on 11/15/20 at  8:00 AM EDT by a video-enabled telemedicine application and verified that I am speaking with the correct person using two identifiers.  Location: Patient: Virtual Visit Location Patient: Home Provider: Virtual Visit Location Provider: Home Office   I discussed the limitations of evaluation and management by  telemedicine and the availability of in person appointments. The patient expressed understanding and agreed to proceed.    History of Present Illness: Sherry [name] is a 35 y.o. who identifies as a female who was assigned female at birth, and is being seen today for sore throat.  HPI: Sore Throat  This is a new problem. The current episode started in the past 7 days. The problem has been gradually worsening. There has been no fever. Associated symptoms include congestion, coughing, swollen glands and trouble swallowing (only hurts on left side). Pertinent negatives include no ear discharge, ear pain, headaches, hoarse voice or shortness of breath. She has had no exposure to strep or mono. Treatments tried: dayquil. The treatment provided no relief.     Problems:  Patient Active Problem List   Diagnosis Date Noted   Encounter for surveillance of contraceptive device 06/07/2020   Presence of IUD 10/11/2019   Bacterial vaginitis 06/26/2016    Allergies: No Known Allergies Medications:  Current Outpatient Medications:    amoxicillin (AMOXIL) 500 MG capsule, Take 1 capsule (500 mg total) by mouth 2 (two) times daily for 10 days., Disp: 20 capsule, Rfl: 0   acetaminophen (TYLENOL) 325 MG tablet, Take 650 mg by mouth every 6 (six) hours as needed for moderate pain. (Patient not taking: No sig reported), Disp: , Rfl:    COVID-19 mRNA vaccine, Pfizer, 30  MCG/0.3ML injection, INJECT AS DIRECTED (Patient not taking: No sig reported), Disp: .3 mL, Rfl: 0   ibuprofen (ADVIL,MOTRIN) 600 MG tablet, Take 1 tablet (600 mg total) by mouth every 6 (six) hours as needed. (Patient not taking: No sig reported), Disp: 30 tablet, Rfl: 0   metroNIDAZOLE (FLAGYL) 500 MG tablet, Take 1 tablet by mouth 2 times daily--do not drink alcohol while on this medication, Disp: 14 tablet, Rfl: 0   valACYclovir (VALTREX) 500 MG tablet, Take 1 tablet (500 mg total) by mouth 2 (two) times daily. (Patient not taking: Reported on  06/28/2020), Disp: 6 tablet, Rfl: 11  Observations/Objective: Patient is well-developed, well-nourished in no acute distress.  Resting comfortably at home.  Head is normocephalic, atraumatic.  No labored breathing.  Speech is clear and coherent with logical content.  Patient is alert and oriented at baseline.    Assessment and Plan: 1. Strep throat - amoxicillin (AMOXIL) 500 MG capsule; Take 1 capsule (500 mg total) by mouth 2 (two) times daily for 10 days.  Dispense: 20 capsule; Refill: 0  - Suspect strep throat - Treat with amoxicillin as above - Salt water gargles and chloraseptic spray as needed - Tylenol and ibuprofen as needed - Push fluids - Rest - Seek in person evaluation if not improving or worsening  Follow Up Instructions: I discussed the assessment and treatment plan with the patient. The patient was provided an opportunity to ask questions and all were answered. The patient agreed with the plan and demonstrated an understanding of the instructions.  A copy of instructions were sent to the patient via MyChart unless otherwise noted below.    The patient was advised to call back or seek an in-person evaluation if the symptoms worsen or if the condition fails to improve as anticipated.  Time:  I spent 12 minutes with the patient via telehealth technology discussing the above problems/concerns.    Margaretann Loveless, PA-C

## 2020-11-22 ENCOUNTER — Other Ambulatory Visit (HOSPITAL_COMMUNITY): Payer: Self-pay

## 2020-12-14 ENCOUNTER — Other Ambulatory Visit (HOSPITAL_COMMUNITY): Payer: Self-pay

## 2020-12-20 ENCOUNTER — Other Ambulatory Visit: Payer: Self-pay

## 2020-12-20 ENCOUNTER — Ambulatory Visit (INDEPENDENT_AMBULATORY_CARE_PROVIDER_SITE_OTHER): Payer: BC Managed Care – PPO | Admitting: Medical

## 2020-12-20 DIAGNOSIS — N912 Amenorrhea, unspecified: Secondary | ICD-10-CM

## 2020-12-20 LAB — POCT URINE PREGNANCY: Preg Test, Ur: POSITIVE — AB

## 2020-12-20 NOTE — Progress Notes (Signed)
Patient came in for a pregnancy test. Patient stated her last LMP is 11/17/2020.  Positive Pregnancy test result. Patient is aware.  Patient is scheduled for New OB Intake appointment on 12/31/2020.  Felecia Shelling, [place]  12/20/2020  Chart reviewed for nurse visit. Agree with plan of care.   Marny Lowenstein, PA-C 01/31/2021 3:39 PM

## 2020-12-25 DIAGNOSIS — B379 Candidiasis, unspecified: Secondary | ICD-10-CM

## 2020-12-25 DIAGNOSIS — N760 Acute vaginitis: Secondary | ICD-10-CM

## 2020-12-25 DIAGNOSIS — B009 Herpesviral infection, unspecified: Secondary | ICD-10-CM

## 2020-12-29 ENCOUNTER — Other Ambulatory Visit (HOSPITAL_COMMUNITY): Payer: Self-pay

## 2020-12-31 ENCOUNTER — Other Ambulatory Visit (HOSPITAL_COMMUNITY)
Admission: RE | Admit: 2020-12-31 | Discharge: 2020-12-31 | Disposition: A | Discharge status: Home / Self Care | Payer: BC Managed Care – PPO | Source: Ambulatory Visit

## 2020-12-31 ENCOUNTER — Other Ambulatory Visit: Payer: Self-pay

## 2020-12-31 ENCOUNTER — Ambulatory Visit (INDEPENDENT_AMBULATORY_CARE_PROVIDER_SITE_OTHER): Payer: BC Managed Care – PPO | Admitting: *Deleted

## 2020-12-31 ENCOUNTER — Other Ambulatory Visit (HOSPITAL_COMMUNITY): Payer: Self-pay

## 2020-12-31 VITALS — BP 122/76 | HR 85 | Temp 97.9°F | Ht 70.0 in | Wt 238.4 lb

## 2020-12-31 DIAGNOSIS — N898 Other specified noninflammatory disorders of vagina: Secondary | ICD-10-CM | POA: Insufficient documentation

## 2020-12-31 DIAGNOSIS — Z113 Encounter for screening for infections with a predominantly sexual mode of transmission: Secondary | ICD-10-CM | POA: Insufficient documentation

## 2020-12-31 MED ORDER — VALACYCLOVIR HCL 500 MG PO TABS
500.0000 mg | ORAL_TABLET | Freq: Two times a day (BID) | ORAL | 12 refills | Status: DC
  Filled 2020-12-31: qty 60, 30d supply, fill #0
  Filled 2021-01-02: qty 48, 24d supply, fill #0

## 2020-12-31 NOTE — Progress Notes (Signed)
° ° °  SUBJECTIVE:  35 y.o. female complains of white and malodorous vaginal discharge for several  day(s). Denies abnormal vaginal bleeding or significant pelvic pain or fever. No UTI symptoms. Denies history of known exposure to STD.  Patient's last menstrual period was 11/17/2020.  OBJECTIVE:  She appears well, afebrile. Urine dipstick: not done.  ASSESSMENT:  Vaginal Discharge  Vaginal Odor   PLAN:  GC, chlamydia, trichomonas, BVAG, CVAG probe sent to lab. Treatment: To be determined once lab results are received ROV prn if symptoms persist or worsen.  Labs for HIV, RPR, Hep B and Hep C  Clovis Pu, RN

## 2021-01-01 LAB — CERVICOVAGINAL ANCILLARY ONLY
Bacterial Vaginitis (gardnerella): POSITIVE — AB
Candida Glabrata: NEGATIVE
Candida Vaginitis: POSITIVE — AB
Chlamydia: NEGATIVE
Comment: NEGATIVE
Comment: NEGATIVE
Comment: NEGATIVE
Comment: NEGATIVE
Comment: NEGATIVE
Comment: NORMAL
Neisseria Gonorrhea: NEGATIVE
Trichomonas: NEGATIVE

## 2021-01-01 LAB — RPR+HBSAG+HCVAB+...
HIV Screen 4th Generation wRfx: NONREACTIVE
Hep C Virus Ab: <0.1 s/co ratio (ref 0.0–0.9)
Hepatitis B Surface Ag: NEGATIVE
RPR Ser Ql: NONREACTIVE

## 2021-01-02 ENCOUNTER — Other Ambulatory Visit (HOSPITAL_COMMUNITY): Payer: Self-pay

## 2021-01-02 MED ORDER — METRONIDAZOLE 500 MG PO TABS
500.0000 mg | ORAL_TABLET | Freq: Two times a day (BID) | ORAL | 0 refills | Status: DC
  Filled 2021-01-02: qty 14, 7d supply, fill #0

## 2021-01-02 MED ORDER — FLUCONAZOLE 150 MG PO TABS
150.0000 mg | ORAL_TABLET | Freq: Once | ORAL | 0 refills | Status: AC
  Filled 2021-01-02: qty 2, 3d supply, fill #0

## 2021-01-08 ENCOUNTER — Other Ambulatory Visit: Payer: Self-pay | Admitting: *Deleted

## 2021-01-08 MED ORDER — TERCONAZOLE 0.4 % VA CREA: 1.0000 | TOPICAL_CREAM | Freq: Every day | VAGINAL | 0 refills | Status: DC

## 2021-01-10 DIAGNOSIS — F411 Generalized anxiety disorder: Secondary | ICD-10-CM | POA: Diagnosis not present

## 2021-01-10 DIAGNOSIS — Z638 Other specified problems related to primary support group: Secondary | ICD-10-CM | POA: Diagnosis not present

## 2021-01-13 NOTE — L&D Delivery Note (Signed)
LABOR COURSE Patient was admitted for IOL 2/2 AMAHer cervix was ripened with Cytotec and a foley balloon. She was augmented with Pitocin and was AROM'd at 1800. She progressed to complete without further intervention.  Delivery Note Called to room and patient was complete and pushing. Head delivered ROT. No nuchal cord present. Shoulder and body delivered in usual fashion. At 0037 a viable female was delivered via Vaginal, Spontaneous (Presentation: ROT; ROA ).  Infant with spontaneous cry, placed on mother's abdomen, dried and stimulated. Cord clamped x 2 after three-minute delay, and cut by FOB Darron. Cord blood drawn. Placenta delivered spontaneously with gentle cord traction. Appears intact. Fundus firm with massage and Pitocin. Labia, perineum, vagina, and cervix inspected.    APGAR:9,9; weight: 3210g  .   Cord: 3VC with the following complications:N/A.    Anesthesia:  Epidural Episiotomy: None Lacerations: None Est. Blood Loss (mL): 100  Mom to postpartum.  Baby to Couplet care / Skin to Skin.  Clayton Bibles, [facility] 08/24/21 6:36 AM

## 2021-01-17 DIAGNOSIS — F411 Generalized anxiety disorder: Secondary | ICD-10-CM | POA: Diagnosis not present

## 2021-01-17 DIAGNOSIS — Z638 Other specified problems related to primary support group: Secondary | ICD-10-CM | POA: Diagnosis not present

## 2021-01-23 DIAGNOSIS — F411 Generalized anxiety disorder: Secondary | ICD-10-CM | POA: Diagnosis not present

## 2021-01-23 DIAGNOSIS — Z638 Other specified problems related to primary support group: Secondary | ICD-10-CM | POA: Diagnosis not present

## 2021-01-24 DIAGNOSIS — Z638 Other specified problems related to primary support group: Secondary | ICD-10-CM | POA: Diagnosis not present

## 2021-01-24 DIAGNOSIS — F411 Generalized anxiety disorder: Secondary | ICD-10-CM | POA: Diagnosis not present

## 2021-01-28 ENCOUNTER — Ambulatory Visit (INDEPENDENT_AMBULATORY_CARE_PROVIDER_SITE_OTHER): Payer: BC Managed Care – PPO | Admitting: *Deleted

## 2021-01-28 ENCOUNTER — Other Ambulatory Visit: Payer: Self-pay

## 2021-01-28 VITALS — BP 127/75 | HR 92 | Temp 98.2°F | Wt 245.4 lb

## 2021-01-28 DIAGNOSIS — Z3481 Encounter for supervision of other normal pregnancy, first trimester: Secondary | ICD-10-CM | POA: Diagnosis not present

## 2021-01-28 DIAGNOSIS — Z3480 Encounter for supervision of other normal pregnancy, unspecified trimester: Secondary | ICD-10-CM | POA: Diagnosis not present

## 2021-01-28 DIAGNOSIS — Z31430 Encounter of female for testing for genetic disease carrier status for procreative management: Secondary | ICD-10-CM | POA: Diagnosis not present

## 2021-01-28 DIAGNOSIS — O0990 Supervision of high risk pregnancy, unspecified, unspecified trimester: Secondary | ICD-10-CM | POA: Insufficient documentation

## 2021-01-28 NOTE — Progress Notes (Signed)
° ° °  I discussed the limitations, risks, security and privacy concerns of performing an evaluation and management service by telephone and the availability of in person appointments. I also discussed with the patient that there may be a patient responsible charge related to this service. The patient expressed understanding and agreed to proceed.   Location: [facility] Patient: clinic with FOB Provider: clinic Interpreter used: no professional interpreter  PRENATAL INTAKE SUMMARY  Ms. Sherry [name] presents today New OB Nurse Interview.  OB History     Gravida  3   Para  1   Term  1   Preterm  0   AB  1   Living  1      SAB  0   IAB  0   Ectopic  0   Multiple  0   Live Births  1          I have reviewed the patient's medical, obstetrical, social, and family histories, medications, and available lab results.  SUBJECTIVE She has no unusual complaints  OBJECTIVE Initial Nurse interview for history/labs (New OB)  last menstrual period date: 11/17/2020 EDD: 08/24/2021 by LMP  GA: [redacted]w[redacted]d G3P1011 FHT: unable to assess   GENERAL APPEARANCE: alert, well appearing, in no apparent distress, oriented to person, place and time   ASSESSMENT Normal pregnancy  PLAN Prenatal care:  [facility] Pnl/HIV/Hep C OB Urine Culture GC/CT PAP: approximate date 06/07/2020 and was normal HgbEval/SMA/CF ([facility]) [facility] A1C Ultrasound >14 weeks to confirm dating/viability  Blood Pressure Monitor/Weight Scale BP monitor given Weight Scale home  MyChart/Babyscripts MyChart access verified. I explained pt will have some visits in office and some virtually. Babyscripts instructions given and order placed.   Placed OB Box on problem list and updated   Followup with Raelyn Mora, CNM APP 02/07/2021 at 3:15 PM  Follow Up Instructions:   I discussed the assessment and treatment plan with the patient. The patient was provided an opportunity to ask questions and all  were answered. The patient agreed with the plan and demonstrated an understanding of the instructions.   The patient was advised to call back or seek an in-person evaluation if the symptoms worsen or if the condition fails to improve as anticipated.  I provided 35 minutes of  face-to-face time during this encounter.  Clovis Pu, RN

## 2021-01-28 NOTE — Patient Instructions (Signed)
° °Please remember that if you are not able to keep your appointment to call the office and reschedule, or cancel. If you no-show or cancel with less than 24 hour notice we will charge you, not your insurance a $50 fee.  ° °Genetic Screening Results Information: °You are having genetic testing called [facility] today.  It will take approximately 2 weeks before the results are available.  To get your results, you need Internet access to a web browser to search [place]/MyChart (the direct app on your phone will not give you these results).  Then select Lab Scanned and click on the blue hyper link that says View Image to see your [facility]ama results.  You can also use the directions on the purple card given to look up your results directly on the [facility] website.  ° °AREA PEDIATRIC/FAMILY PRACTICE PHYSICIANS ° °[facility] °[address] °[address] °[place], [place] [address] °Phone - [phone]   Fax - [phone] ° °[facility] °[address] °[place], [place]  [address] °Phone - [phone]   Fax - [phone] ° °[facility] °[address] °[place], [place]  [address] °Phone - [phone]   Fax - [phone] ° °[facility] °[address] °[place], [place]  [address] °Phone - [phone]   Fax - [phone] ° °[facility] °[address] °[place], [place]  [address] °Phone - [phone]   Fax - [phone] ° °[facility] °[address] °[place], [place]  [address] °Phone - [phone]   Fax - [phone] ° °[facility] °[address] °[place], [place]  [address] °Phone - [phone]   Fax - [phone] ° °[facility] °[address] °[place], [place]  [address] °Phone - [phone]   Fax - [phone] ° °[facility] °[address] °[place], [place]  [address] °Phone - [phone]   Fax -  [phone] °[facility] °[address] °[place], [place]  [address] °Phone - [phone]   Fax - [phone] ° °[facility] °[address] °[place], [place]  [address] °Phone - [phone]   Fax - [phone] ° °[facility] °[address] °[place], [place]  [address] °Phone - [phone]   Fax - [phone] ° °[facility] °[address] °[place], [place]  [address] °Phone - [phone]   Fax - [phone] ° °[facility] °[address] °[place], [place]  [address] °Phone - [phone] ° °[facility] °[address] °[place], [place]  [address] °Phone - [phone]   Fax - [phone] ° °[facility] °[address] °[place], [place]  [address] °Phone - [phone]   Fax - [phone] ° °[facility] °[address] °[place], [place]  [address] °Phone - [phone]   Fax - [phone] ° °[facility] °[address] °[place], [place]  [address] °Phone - [phone]   Fax - [phone] ° ° °[facility] °[address] °[place], [place]  [address] °Phone - [phone]   Fax - [phone] ° °[facility] °[address] °,   [address] °Phone - [phone]   Fax - [phone] ° °[facility] °[address] °,   [address] °Phone - [phone]   Fax - [phone] ° °[facility] °[address] °[place], [place]  [address] °Phone - [phone]   Fax - [phone] ° °[facility] °[address] °[place], [place]  [address] °Phone - [phone]   Fax - [phone] °[facility] °[address] °[place], [place]  [address] °Phone - [phone]   Fax - [phone] ° °[facility] °[address] °[place], [place]  [address] °Phone - [phone]   Fax - [phone] ° °[facility] °[address] °[place], [place]  [address] °Phone - [phone]   Fax - [phone] °  °

## 2021-01-29 LAB — CBC/D/PLT+RPR+RH+ABO+RUBIGG...
Antibody Screen: NEGATIVE
Basophils Absolute: 0 10*3/uL (ref 0.0–0.2)
Basos: 0 %
EOS (ABSOLUTE): 0.1 10*3/uL (ref 0.0–0.4)
Eos: 2 %
HCV Ab: <0.1 s/co ratio (ref 0.0–0.9)
HIV Screen 4th Generation wRfx: NONREACTIVE
Hematocrit: 37.5 % (ref 34.0–46.6)
Hemoglobin: 12.8 g/dL (ref 11.1–15.9)
Hepatitis B Surface Ag: NEGATIVE
Immature Grans (Abs): 0 10*3/uL (ref 0.0–0.1)
Immature Granulocytes: 0 %
Lymphocytes Absolute: 2 10*3/uL (ref 0.7–3.1)
Lymphs: 24 %
MCH: 27.9 pg (ref 26.6–33.0)
MCHC: 34.1 g/dL (ref 31.5–35.7)
MCV: 82 fL (ref 79–97)
Monocytes Absolute: 0.5 10*3/uL (ref 0.1–0.9)
Monocytes: 6 %
Neutrophils Absolute: 5.6 10*3/uL (ref 1.4–7.0)
Neutrophils: 68 %
Platelets: 380 10*3/uL (ref 150–450)
RBC: 4.58 x10E6/uL (ref 3.77–5.28)
RDW: 12.7 % (ref 11.7–15.4)
RPR Ser Ql: NONREACTIVE
Rh Factor: POSITIVE
Rubella Antibodies, IGG: 1.66 index (ref >0.99)
WBC: 8.2 10*3/uL (ref 3.4–10.8)

## 2021-01-29 LAB — HEMOGLOBIN A1C
Est. average glucose Bld gHb Est-mCnc: 117 mg/dL
Hgb A1c MFr Bld: 5.7 % — ABNORMAL HIGH (ref 4.8–5.6)

## 2021-01-29 LAB — HCV INTERPRETATION

## 2021-01-30 DIAGNOSIS — F411 Generalized anxiety disorder: Secondary | ICD-10-CM | POA: Diagnosis not present

## 2021-01-30 DIAGNOSIS — Z638 Other specified problems related to primary support group: Secondary | ICD-10-CM | POA: Diagnosis not present

## 2021-01-30 LAB — CULTURE, OB URINE

## 2021-01-30 LAB — URINE CULTURE, OB REFLEX

## 2021-01-31 DIAGNOSIS — Z638 Other specified problems related to primary support group: Secondary | ICD-10-CM | POA: Diagnosis not present

## 2021-01-31 DIAGNOSIS — F411 Generalized anxiety disorder: Secondary | ICD-10-CM | POA: Diagnosis not present

## 2021-02-04 ENCOUNTER — Ambulatory Visit (INDEPENDENT_AMBULATORY_CARE_PROVIDER_SITE_OTHER): Payer: BC Managed Care – PPO | Admitting: *Deleted

## 2021-02-04 ENCOUNTER — Other Ambulatory Visit: Payer: Self-pay

## 2021-02-04 ENCOUNTER — Ambulatory Visit (HOSPITAL_BASED_OUTPATIENT_CLINIC_OR_DEPARTMENT_OTHER)
Admission: RE | Admit: 2021-02-04 | Discharge: 2021-02-04 | Disposition: A | Discharge status: Home / Self Care | Payer: BC Managed Care – PPO | Source: Ambulatory Visit | Attending: Obstetrics and Gynecology | Admitting: Obstetrics and Gynecology

## 2021-02-04 VITALS — BP 119/68 | HR 87 | Temp 97.5°F | Wt 248.4 lb

## 2021-02-04 DIAGNOSIS — O368390 Maternal care for abnormalities of the fetal heart rate or rhythm, unspecified trimester, not applicable or unspecified: Secondary | ICD-10-CM

## 2021-02-04 DIAGNOSIS — Z3689 Encounter for other specified antenatal screening: Secondary | ICD-10-CM | POA: Insufficient documentation

## 2021-02-04 DIAGNOSIS — Z3480 Encounter for supervision of other normal pregnancy, unspecified trimester: Secondary | ICD-10-CM | POA: Diagnosis not present

## 2021-02-04 DIAGNOSIS — O3680X0 Pregnancy with inconclusive fetal viability, not applicable or unspecified: Secondary | ICD-10-CM | POA: Diagnosis not present

## 2021-02-04 DIAGNOSIS — Z3A11 11 weeks gestation of pregnancy: Secondary | ICD-10-CM | POA: Diagnosis not present

## 2021-02-04 DIAGNOSIS — Z3687 Encounter for antenatal screening for uncertain dates: Secondary | ICD-10-CM | POA: Insufficient documentation

## 2021-02-04 LAB — POCT GLYCOSYLATED HEMOGLOBIN (HGB A1C)
HbA1c POC (<> result, manual entry): 5.5 % (ref 4.0–5.6)
HbA1c, POC (controlled diabetic range): 5.5 % (ref 0.0–7.0)
HbA1c, POC (prediabetic range): 5.5 % — AB (ref 5.7–6.4)
Hemoglobin A1C: 5.5 % (ref 4.0–5.6)

## 2021-02-04 NOTE — Progress Notes (Signed)
° °  Patient in clinic for fetal heart tone and repeat Hgb A1c. Fetal heart tone today 156. Hgb A1c 5.5 today fasting.   Ultrasound today to check viability and dating.  Clovis Pu, RN

## 2021-02-06 DIAGNOSIS — F411 Generalized anxiety disorder: Secondary | ICD-10-CM | POA: Diagnosis not present

## 2021-02-06 DIAGNOSIS — Z630 Problems in relationship with spouse or partner: Secondary | ICD-10-CM | POA: Diagnosis not present

## 2021-02-07 ENCOUNTER — Other Ambulatory Visit: Payer: Self-pay

## 2021-02-07 ENCOUNTER — Ambulatory Visit (INDEPENDENT_AMBULATORY_CARE_PROVIDER_SITE_OTHER): Payer: BC Managed Care – PPO | Admitting: Obstetrics and Gynecology

## 2021-02-07 VITALS — BP 119/78 | HR 97 | Temp 97.8°F | Wt 248.6 lb

## 2021-02-07 DIAGNOSIS — Z6835 Body mass index (BMI) 35.0-35.9, adult: Secondary | ICD-10-CM | POA: Diagnosis not present

## 2021-02-07 DIAGNOSIS — O99210 Obesity complicating pregnancy, unspecified trimester: Secondary | ICD-10-CM | POA: Diagnosis not present

## 2021-02-07 DIAGNOSIS — E669 Obesity, unspecified: Secondary | ICD-10-CM

## 2021-02-07 DIAGNOSIS — Z638 Other specified problems related to primary support group: Secondary | ICD-10-CM | POA: Diagnosis not present

## 2021-02-07 DIAGNOSIS — O09529 Supervision of elderly multigravida, unspecified trimester: Secondary | ICD-10-CM

## 2021-02-07 DIAGNOSIS — Z3480 Encounter for supervision of other normal pregnancy, unspecified trimester: Secondary | ICD-10-CM | POA: Diagnosis not present

## 2021-02-07 DIAGNOSIS — F411 Generalized anxiety disorder: Secondary | ICD-10-CM | POA: Diagnosis not present

## 2021-02-08 ENCOUNTER — Encounter: Payer: Self-pay | Admitting: Obstetrics and Gynecology

## 2021-02-08 DIAGNOSIS — O99210 Obesity complicating pregnancy, unspecified trimester: Secondary | ICD-10-CM | POA: Insufficient documentation

## 2021-02-08 DIAGNOSIS — O09529 Supervision of elderly multigravida, unspecified trimester: Secondary | ICD-10-CM | POA: Insufficient documentation

## 2021-02-08 DIAGNOSIS — Z6837 Body mass index (BMI) 37.0-37.9, adult: Secondary | ICD-10-CM | POA: Insufficient documentation

## 2021-02-08 DIAGNOSIS — E669 Obesity, unspecified: Secondary | ICD-10-CM | POA: Insufficient documentation

## 2021-02-08 MED ORDER — ASPIRIN 81 MG PO CHEW
81.0000 mg | CHEWABLE_TABLET | Freq: Every day | ORAL | 8 refills | Status: DC
  Filled 2021-02-08: qty 30, 30d supply, fill #0

## 2021-02-08 NOTE — Progress Notes (Signed)
INITIAL OBSTETRICAL VISIT Patient name: Sherry [name] MRN [id number]  Date of birth: 05-01-1985 Chief Complaint:   Initial Prenatal Visit  History of Present Illness:   Sherry [name] is a 36 y.o. G14P1011 African American female at [redacted]w[redacted]d by [place] with an Estimated Date of Delivery: 08/24/21 being seen today for her initial obstetrical visit.  Her obstetrical history is significant for obesity. This is a planned pregnancy. She and the father of the baby (FOB) "Darron" live together. She has a support system that consists of fiance'/her mother/father/family/friends. Today she reports headache and occ cramping .   Patient's last menstrual period was 11/17/2020. Last pap 06/07/2020. Results were: normal Review of Systems:   Pertinent items are noted in HPI Denies cramping/contractions, leakage of fluid, vaginal bleeding, abnormal vaginal discharge w/ itching/odor/irritation, headaches, visual changes, shortness of breath, chest pain, abdominal pain, severe nausea/vomiting, or problems with urination or bowel movements unless otherwise stated above.  Pertinent History Reviewed:  Reviewed past medical,surgical, social, obstetrical and family history.  Reviewed problem list, medications and allergies. OB History  Gravida Para Term Preterm AB Living  3 1 1  0 1 1  SAB IAB Ectopic Multiple Live Births  0 0 0 0 1    # Outcome Date GA Lbr Len/2nd Weight Sex Delivery Anes PTL Lv  3 Current           2 Term 08/07/10 [redacted]w[redacted]d 04:00 / 00:57 8 lb 7.6 oz (3.844 kg) F Vag-Spont EPI  LIV  1 AB              Birth Comments: System Generated. Please review and update pregnancy details.   Physical Assessment:   Vitals:   02/07/21 1505  BP: 119/78  Pulse: 97  Temp: 97.8 F (36.6 C)  Weight: 248 lb 9.6 oz (112.8 kg)  Body mass index is 35.67 kg/m.       Physical Examination:  General appearance - well appearing, and in no distress  Mental status - alert, oriented to person, place, and time  Psych:  She has  a normal mood and affect  Skin - warm and dry, normal color, no suspicious lesions noted  Chest - effort normal, all lung fields clear to auscultation bilaterally  Heart - normal rate and regular rhythm  Abdomen - soft, nontender  Extremities:  No swelling or varicosities noted  Pelvic - VULVA: normal appearing vulva with no masses, tenderness or lesions  VAGINA: normal appearing vagina with normal color and discharge, no lesions.   CERVIX: normal appearing cervix without discharge or lesions, no CMT  Thin prep pap is not done   FHTs by doppler: 155 bpm  Assessment & Plan:  1) Low-Risk Pregnancy G3P1011 at [redacted]w[redacted]d with an Estimated Date of Delivery: 08/24/21   2) Initial OB visit - Welcomed to practice and introduced self to patient in addition to discussing other advanced practice providers that she may be seeing at this practice - Congratulated patient - Anticipatory guidance on upcoming appointments - Educated on [place] and pregnancy and the integration of virtual appointments  - Educated on babyscripts app- patient reports she has not received email, encouraged to look in spam folder and to call office if she still has not received email - patient verbalizes understanding    3) Supervision of other normal pregnancy, antepartum - [place] MFM OB DETAIL +14 WK; Future  4) Obesity affecting pregnancy, antepartum - [place] MFM OB DETAIL +14 WK; Future  5) Body mass index (BMI) of 35.0 to  35.9 in adult - [place] MFM OB DETAIL +14 WK; Future  6) Obesity, Class II, BMI 35-39.9 - Start bASA at 12 weeks  7) Maternal age 40+, multigravida, antepartum    Meds: No orders of the defined types were placed in this encounter.   Initial labs obtained Continue prenatal vitamins Reviewed n/v relief measures and warning s/s to report Reviewed recommended weight gain based on pre-gravid BMI Encouraged well-balanced diet Genetic Screening discussed: results reviewed Cystic fibrosis, SMA, Fragile X screening  discussed results reviewed The nature of [facility] with multiple MDs and other Advanced Practice Providers was explained to patient; also emphasized that residents, students are part of our team.  Discussed optimized OB schedule and video visits. Advised can have an in-office visit whenever she feels she needs to be seen.  Does have own BP cuff. Check BP weekly, let us know if >140/90. Advised to call during normal business hours and there is an after-hours nurse line available.    Follow-up: Return in about 5 weeks (around 03/14/2021) for Return OB visit w/AFP.   Orders Placed This Encounter  Procedures   [place] MFM OB DETAIL +14 [place] MSN, [place] 02/07/2021

## 2021-02-09 ENCOUNTER — Other Ambulatory Visit (HOSPITAL_COMMUNITY): Payer: Self-pay

## 2021-02-11 ENCOUNTER — Other Ambulatory Visit (HOSPITAL_BASED_OUTPATIENT_CLINIC_OR_DEPARTMENT_OTHER): Payer: BC Managed Care – PPO

## 2021-02-11 ENCOUNTER — Ambulatory Visit: Payer: BC Managed Care – PPO

## 2021-02-13 DIAGNOSIS — F411 Generalized anxiety disorder: Secondary | ICD-10-CM | POA: Diagnosis not present

## 2021-02-13 DIAGNOSIS — Z630 Problems in relationship with spouse or partner: Secondary | ICD-10-CM | POA: Diagnosis not present

## 2021-02-14 DIAGNOSIS — F411 Generalized anxiety disorder: Secondary | ICD-10-CM | POA: Diagnosis not present

## 2021-02-14 DIAGNOSIS — Z630 Problems in relationship with spouse or partner: Secondary | ICD-10-CM | POA: Diagnosis not present

## 2021-02-18 ENCOUNTER — Other Ambulatory Visit: Payer: Self-pay

## 2021-02-18 ENCOUNTER — Other Ambulatory Visit (HOSPITAL_COMMUNITY)
Admission: RE | Admit: 2021-02-18 | Discharge: 2021-02-18 | Disposition: A | Discharge status: Home / Self Care | Payer: BC Managed Care – PPO | Source: Ambulatory Visit | Attending: Obstetrics and Gynecology | Admitting: Obstetrics and Gynecology

## 2021-02-18 ENCOUNTER — Ambulatory Visit (INDEPENDENT_AMBULATORY_CARE_PROVIDER_SITE_OTHER): Payer: BC Managed Care – PPO

## 2021-02-18 VITALS — BP 122/76 | HR 116 | Wt 249.0 lb

## 2021-02-18 DIAGNOSIS — Z3A13 13 weeks gestation of pregnancy: Secondary | ICD-10-CM

## 2021-02-18 DIAGNOSIS — N898 Other specified noninflammatory disorders of vagina: Secondary | ICD-10-CM

## 2021-02-18 DIAGNOSIS — B009 Herpesviral infection, unspecified: Secondary | ICD-10-CM

## 2021-02-18 DIAGNOSIS — O99210 Obesity complicating pregnancy, unspecified trimester: Secondary | ICD-10-CM

## 2021-02-18 MED ORDER — VALACYCLOVIR HCL 500 MG PO TABS: 500.0000 mg | ORAL_TABLET | Freq: Two times a day (BID) | ORAL | 12 refills | Status: DC

## 2021-02-18 MED ORDER — ASPIRIN 81 MG PO CHEW: 81.0000 mg | CHEWABLE_TABLET | Freq: Every day | ORAL | 8 refills | Status: DC

## 2021-02-18 NOTE — Progress Notes (Signed)
SUBJECTIVE:  36 y.o. female complains of clear vaginal discharge for 2 day(s). Denies abnormal vaginal bleeding or significant pelvic pain or fever. No UTI symptoms. Denies history of known exposure to STD.  Patient's last menstrual period was 11/17/2020.  OBJECTIVE:  She appears well, afebrile. Urine dipstick: not done.  ASSESSMENT:  Vaginal Discharge  Vaginal Odor   PLAN:   BVAG, CVAG probe sent to lab. Treatment: To be determined once lab results are received ROV prn if symptoms persist or worsen.   Latravia Southgate l Tahari Clabaugh, CMA

## 2021-02-19 ENCOUNTER — Other Ambulatory Visit: Payer: Self-pay | Admitting: Obstetrics and Gynecology

## 2021-02-19 DIAGNOSIS — N760 Acute vaginitis: Secondary | ICD-10-CM

## 2021-02-19 DIAGNOSIS — B9689 Other specified bacterial agents as the cause of diseases classified elsewhere: Secondary | ICD-10-CM

## 2021-02-19 LAB — CERVICOVAGINAL ANCILLARY ONLY
Bacterial Vaginitis (gardnerella): POSITIVE — AB
Candida Glabrata: NEGATIVE
Candida Vaginitis: NEGATIVE
Comment: NEGATIVE
Comment: NEGATIVE
Comment: NEGATIVE

## 2021-02-19 MED ORDER — METRONIDAZOLE 500 MG PO TABS: 500.0000 mg | ORAL_TABLET | Freq: Two times a day (BID) | ORAL | 0 refills | Status: DC

## 2021-02-19 NOTE — Progress Notes (Signed)
Patient was assessed and managed by nursing staff during this encounter. I have reviewed the chart and agree with the documentation and plan. I have also made any necessary editorial changes.  Raelyn Mora, CNM 02/19/2021 6:48 PM

## 2021-02-20 DIAGNOSIS — Z638 Other specified problems related to primary support group: Secondary | ICD-10-CM | POA: Diagnosis not present

## 2021-02-20 DIAGNOSIS — F411 Generalized anxiety disorder: Secondary | ICD-10-CM | POA: Diagnosis not present

## 2021-02-21 DIAGNOSIS — Z638 Other specified problems related to primary support group: Secondary | ICD-10-CM | POA: Diagnosis not present

## 2021-02-21 DIAGNOSIS — F411 Generalized anxiety disorder: Secondary | ICD-10-CM | POA: Diagnosis not present

## 2021-02-27 DIAGNOSIS — F411 Generalized anxiety disorder: Secondary | ICD-10-CM | POA: Diagnosis not present

## 2021-02-27 DIAGNOSIS — Z638 Other specified problems related to primary support group: Secondary | ICD-10-CM | POA: Diagnosis not present

## 2021-02-28 DIAGNOSIS — F411 Generalized anxiety disorder: Secondary | ICD-10-CM | POA: Diagnosis not present

## 2021-02-28 DIAGNOSIS — Z638 Other specified problems related to primary support group: Secondary | ICD-10-CM | POA: Diagnosis not present

## 2021-03-07 ENCOUNTER — Other Ambulatory Visit: Payer: Self-pay

## 2021-03-07 ENCOUNTER — Ambulatory Visit (INDEPENDENT_AMBULATORY_CARE_PROVIDER_SITE_OTHER): Payer: BC Managed Care – PPO | Admitting: Obstetrics and Gynecology

## 2021-03-07 ENCOUNTER — Encounter: Payer: Self-pay | Admitting: Obstetrics and Gynecology

## 2021-03-07 VITALS — BP 130/84 | HR 100 | Temp 98.1°F | Wt 251.8 lb

## 2021-03-07 DIAGNOSIS — O99210 Obesity complicating pregnancy, unspecified trimester: Secondary | ICD-10-CM

## 2021-03-07 DIAGNOSIS — Z3A15 15 weeks gestation of pregnancy: Secondary | ICD-10-CM

## 2021-03-07 DIAGNOSIS — K117 Disturbances of salivary secretion: Secondary | ICD-10-CM

## 2021-03-07 DIAGNOSIS — Z3480 Encounter for supervision of other normal pregnancy, unspecified trimester: Secondary | ICD-10-CM | POA: Diagnosis not present

## 2021-03-07 MED ORDER — GLYCOPYRROLATE 1 MG PO TABS: 1.0000 mg | ORAL_TABLET | Freq: Three times a day (TID) | ORAL | 0 refills | Status: DC

## 2021-03-07 NOTE — Progress Notes (Signed)
° °  LOW-RISK PREGNANCY OFFICE VISIT Patient name: Sherry [name] MRN [id number]  Date of birth: 1985/09/11 Chief Complaint:   Routine Prenatal Visit  History of Present Illness:   Sherry [name] is a 36 y.o. G90P1011 female at [redacted]w[redacted]d with an Estimated Date of Delivery: 08/24/21 being seen today for ongoing management of a low-risk pregnancy.  Today she reports  having a mucousy spit in the back of her throat every day that she has to spit out in a cup. She reports that it usually gets better by mid-morning or afternoon . Just got married this past weekend. Contractions: Irritability. Vag. Bleeding: None.  Movement: Present. denies leaking of fluid. Review of Systems:   Pertinent items are noted in HPI Denies abnormal vaginal discharge w/ itching/odor/irritation, headaches, visual changes, shortness of breath, chest pain, abdominal pain, severe nausea/vomiting, or problems with urination or bowel movements unless otherwise stated above. Pertinent History Reviewed:  Reviewed past medical,surgical, social, obstetrical and family history.  Reviewed problem list, medications and allergies. Physical Assessment:   Vitals:   03/07/21 1122  BP: 130/84  Pulse: 100  Temp: 98.1 F (36.7 C)  Weight: 251 lb 12.8 oz (114.2 kg)  Body mass index is 36.13 kg/m.        Physical Examination:   General appearance: Well appearing, and in no distress  Mental status: Alert, oriented to person, place, and time  Skin: Warm & dry  Cardiovascular: Normal heart rate noted  Respiratory: Normal respiratory effort, no distress  Abdomen: Soft, gravid, nontender  Pelvic: Cervical exam deferred         Extremities: Edema: None  Fetal Status: Fetal Heart Rate (bpm): 143   Movement: Present    No results found for this or any previous visit (from the past 24 hour(s)).  Assessment & Plan:  1) Low-risk pregnancy G3P1011 at [redacted]w[redacted]d with an Estimated Date of Delivery: 08/24/21   2) Supervision of other normal pregnancy,  antepartum - Plans to transfer care to Dr. Cherly Hensen - [facility] office for address  3) Ptyalism  - Rx for glycopyrrolate (ROBINUL) 1 MG tablet - Advised to increase daily water intake  4) Obesity affecting pregnancy, antepartum - Taking daily bASA  5) [redacted] weeks gestation of pregnancy    Meds:  Meds ordered this encounter  Medications   glycopyrrolate (ROBINUL) 1 MG tablet    Sig: Take 1 tablet (1 mg total) by mouth 3 (three) times daily.    Dispense:  90 tablet    Refill:  0    Order Specific Question:   Supervising Provider    Answer:   Reva Bores [[id number]]   Labs/procedures today: none  Plan:  Continue routine obstetrical care   Reviewed: Preterm labor symptoms and general obstetric precautions including but not limited to vaginal bleeding, contractions, leaking of fluid and fetal movement were reviewed in detail with the patient.  All questions were answered. Has home bp cuff. Check bp weekly, let us know if >140/90.   Follow-up: Return in about 4 weeks (around 04/04/2021) for Return OB visit.  No orders of the defined types were placed in this encounter.  Raelyn Mora MSN, CNM 03/07/2021 2:47 PM

## 2021-03-14 DIAGNOSIS — Z638 Other specified problems related to primary support group: Secondary | ICD-10-CM | POA: Diagnosis not present

## 2021-03-14 DIAGNOSIS — F411 Generalized anxiety disorder: Secondary | ICD-10-CM | POA: Diagnosis not present

## 2021-03-20 DIAGNOSIS — Z638 Other specified problems related to primary support group: Secondary | ICD-10-CM | POA: Diagnosis not present

## 2021-03-20 DIAGNOSIS — F411 Generalized anxiety disorder: Secondary | ICD-10-CM | POA: Diagnosis not present

## 2021-03-21 ENCOUNTER — Other Ambulatory Visit: Payer: Self-pay | Admitting: Obstetrics and Gynecology

## 2021-03-21 DIAGNOSIS — K117 Disturbances of salivary secretion: Secondary | ICD-10-CM

## 2021-03-26 ENCOUNTER — Other Ambulatory Visit: Payer: Self-pay

## 2021-03-26 ENCOUNTER — Ambulatory Visit: Payer: BC Managed Care – PPO | Attending: Obstetrics and Gynecology

## 2021-03-26 ENCOUNTER — Ambulatory Visit: Payer: BC Managed Care – PPO | Admitting: *Deleted

## 2021-03-26 VITALS — BP 112/69 | HR 87

## 2021-03-26 DIAGNOSIS — O09522 Supervision of elderly multigravida, second trimester: Secondary | ICD-10-CM | POA: Diagnosis not present

## 2021-03-26 DIAGNOSIS — O99210 Obesity complicating pregnancy, unspecified trimester: Secondary | ICD-10-CM | POA: Insufficient documentation

## 2021-03-26 DIAGNOSIS — Z3A18 18 weeks gestation of pregnancy: Secondary | ICD-10-CM | POA: Insufficient documentation

## 2021-03-26 DIAGNOSIS — O98512 Other viral diseases complicating pregnancy, second trimester: Secondary | ICD-10-CM | POA: Insufficient documentation

## 2021-03-26 DIAGNOSIS — B009 Herpesviral infection, unspecified: Secondary | ICD-10-CM | POA: Diagnosis not present

## 2021-03-26 DIAGNOSIS — E669 Obesity, unspecified: Secondary | ICD-10-CM | POA: Insufficient documentation

## 2021-03-26 DIAGNOSIS — Z363 Encounter for antenatal screening for malformations: Secondary | ICD-10-CM | POA: Diagnosis not present

## 2021-03-26 DIAGNOSIS — O99212 Obesity complicating pregnancy, second trimester: Secondary | ICD-10-CM | POA: Diagnosis not present

## 2021-03-26 DIAGNOSIS — O09529 Supervision of elderly multigravida, unspecified trimester: Secondary | ICD-10-CM | POA: Insufficient documentation

## 2021-03-26 DIAGNOSIS — Z3480 Encounter for supervision of other normal pregnancy, unspecified trimester: Secondary | ICD-10-CM | POA: Diagnosis not present

## 2021-03-26 DIAGNOSIS — Z6835 Body mass index (BMI) 35.0-35.9, adult: Secondary | ICD-10-CM | POA: Insufficient documentation

## 2021-03-27 ENCOUNTER — Telehealth: Payer: Self-pay | Admitting: Obstetrics and Gynecology

## 2021-03-27 ENCOUNTER — Other Ambulatory Visit: Payer: Self-pay | Admitting: *Deleted

## 2021-03-27 DIAGNOSIS — F411 Generalized anxiety disorder: Secondary | ICD-10-CM | POA: Diagnosis not present

## 2021-03-27 DIAGNOSIS — Z638 Other specified problems related to primary support group: Secondary | ICD-10-CM | POA: Diagnosis not present

## 2021-03-27 DIAGNOSIS — Z6831 Body mass index (BMI) 31.0-31.9, adult: Secondary | ICD-10-CM

## 2021-03-27 DIAGNOSIS — O09522 Supervision of elderly multigravida, second trimester: Secondary | ICD-10-CM

## 2021-03-27 NOTE — Telephone Encounter (Signed)
Patient had requested to be scheduled at the [facility] location. I called [facility] to give her the appointment information. Left a detailed message to call [facility] if the appointment date and time did not work for her. ?

## 2021-03-28 ENCOUNTER — Other Ambulatory Visit: Payer: Self-pay

## 2021-03-28 ENCOUNTER — Ambulatory Visit (INDEPENDENT_AMBULATORY_CARE_PROVIDER_SITE_OTHER): Payer: BC Managed Care – PPO | Admitting: Obstetrics and Gynecology

## 2021-03-28 VITALS — BP 127/85 | HR 112 | Wt 257.2 lb

## 2021-03-28 DIAGNOSIS — O09529 Supervision of elderly multigravida, unspecified trimester: Secondary | ICD-10-CM

## 2021-03-28 DIAGNOSIS — Z3A18 18 weeks gestation of pregnancy: Secondary | ICD-10-CM

## 2021-03-28 NOTE — Progress Notes (Signed)
? ?  PRENATAL VISIT NOTE ? ?Subjective:  ?Sherry [name] is a 36 y.o. G3P1011 at [redacted]w[redacted]d being seen today for ongoing prenatal care.  She is currently monitored for the following issues for this low-risk pregnancy and has Supervision of other normal pregnancy, antepartum; Obesity affecting pregnancy, antepartum; Obesity, Class II, BMI 35-39.9; and Maternal age 32+, multigravida, antepartum on their problem list. ? ?Patient reports no complaints.  Contractions: Irritability. Vag. Bleeding: None.  Movement: Present. Denies leaking of fluid.  ? ?[name], current medications, past family history, past medical history, past social history, past surgical history and problem list.  ? ?Objective:  ? ?Vitals:  ? 03/28/21 1622  ?BP: 127/85  ?Pulse: (!) 112  ?Weight: 257 lb 3.2 oz (116.7 kg)  ? ? ?Fetal Status: Fetal Heart Rate (bpm): 141   Movement: Present    ? ?General:  Alert, oriented and cooperative. Patient is in no acute distress.  ?Skin: Skin is warm and dry. No rash noted.   ?Cardiovascular: Normal heart rate noted  ?Respiratory: Normal respiratory effort, no problems with respiration noted  ?Abdomen: Soft, gravid, appropriate for gestational age.  Pain/Pressure: Present     ?Pelvic: Cervical exam deferred        ?Extremities: Normal range of motion.  Edema: None  ?Mental Status: Normal mood and affect. Normal behavior. Normal judgment and thought content.  ? ?Assessment and Plan:  ?Pregnancy: G3P1011 at [redacted]w[redacted]d ?1. [redacted] weeks gestation of pregnancy ?Pt amenable to afp. Low normal weight at anatomy scan earlier this week: 3/14 13%, ac 23%, afi wnl, placenta normal. F/u rpt in April. Watch total weight gain (+37 lbs thus far) ? ?2. Maternal age 45+, multigravida, antepartum ?Low risk panorama ?- AFP, Serum, Open Spina Bifida ? ?Preterm labor symptoms and general obstetric precautions including but not limited to vaginal bleeding,  contractions, leaking of fluid and fetal movement were reviewed in detail with the patient. ?Please refer to After Visit Summary for other counseling recommendations.  ? ?Return in about 1 month (around 04/28/2021) for low risk ob, in person, md or app. ? ?Future Appointments  ?Date Time Provider Department Center  ?04/23/2021  3:30 PM [facility] NURSE [facility]  ?04/23/2021  3:45 PM [facility] US4 [facility]  ?04/25/2021  1:10 PM Stanislaus Bing, MD [facility]  ?05/23/2021  8:15 AM Anyanwu, Jethro Bastos, MD [facility]  ?06/06/2021  4:10 PM Anyanwu, Jethro Bastos, MD [facility]  ? ? ?[place] Bing, MD ? ?

## 2021-03-30 LAB — AFP, SERUM, OPEN SPINA BIFIDA
AFP MoM: 0.66
AFP Value: 25.3 ng/mL
Gest. Age on Collection Date: 18.5 weeks
Maternal Age At EDD: 35.9 yr
OSBR Risk 1 IN: 10000
Test Results:: NEGATIVE
Weight: 257 [lb_av]

## 2021-04-02 ENCOUNTER — Encounter: Payer: Self-pay | Admitting: Obstetrics and Gynecology

## 2021-04-03 ENCOUNTER — Encounter: Payer: BC Managed Care – PPO | Admitting: Obstetrics and Gynecology

## 2021-04-03 ENCOUNTER — Encounter: Payer: Self-pay | Admitting: *Deleted

## 2021-04-08 ENCOUNTER — Encounter (INDEPENDENT_AMBULATORY_CARE_PROVIDER_SITE_OTHER): Payer: Self-pay | Admitting: *Deleted

## 2021-04-10 DIAGNOSIS — Z638 Other specified problems related to primary support group: Secondary | ICD-10-CM | POA: Diagnosis not present

## 2021-04-10 DIAGNOSIS — F411 Generalized anxiety disorder: Secondary | ICD-10-CM | POA: Diagnosis not present

## 2021-04-17 DIAGNOSIS — F411 Generalized anxiety disorder: Secondary | ICD-10-CM | POA: Diagnosis not present

## 2021-04-17 DIAGNOSIS — Z638 Other specified problems related to primary support group: Secondary | ICD-10-CM | POA: Diagnosis not present

## 2021-04-23 ENCOUNTER — Ambulatory Visit: Payer: BC Managed Care – PPO | Admitting: *Deleted

## 2021-04-23 ENCOUNTER — Ambulatory Visit: Payer: BC Managed Care – PPO | Attending: Obstetrics

## 2021-04-23 VITALS — BP 120/72 | HR 92

## 2021-04-23 DIAGNOSIS — O321XX0 Maternal care for breech presentation, not applicable or unspecified: Secondary | ICD-10-CM | POA: Diagnosis not present

## 2021-04-23 DIAGNOSIS — O09529 Supervision of elderly multigravida, unspecified trimester: Secondary | ICD-10-CM | POA: Insufficient documentation

## 2021-04-23 DIAGNOSIS — Z362 Encounter for other antenatal screening follow-up: Secondary | ICD-10-CM | POA: Diagnosis not present

## 2021-04-23 DIAGNOSIS — O99210 Obesity complicating pregnancy, unspecified trimester: Secondary | ICD-10-CM

## 2021-04-23 DIAGNOSIS — O365920 Maternal care for other known or suspected poor fetal growth, second trimester, not applicable or unspecified: Secondary | ICD-10-CM | POA: Diagnosis not present

## 2021-04-23 DIAGNOSIS — E669 Obesity, unspecified: Secondary | ICD-10-CM | POA: Insufficient documentation

## 2021-04-23 DIAGNOSIS — O09522 Supervision of elderly multigravida, second trimester: Secondary | ICD-10-CM | POA: Insufficient documentation

## 2021-04-23 DIAGNOSIS — O99212 Obesity complicating pregnancy, second trimester: Secondary | ICD-10-CM | POA: Insufficient documentation

## 2021-04-23 DIAGNOSIS — Z3A22 22 weeks gestation of pregnancy: Secondary | ICD-10-CM | POA: Insufficient documentation

## 2021-04-23 DIAGNOSIS — Z3480 Encounter for supervision of other normal pregnancy, unspecified trimester: Secondary | ICD-10-CM | POA: Insufficient documentation

## 2021-04-23 DIAGNOSIS — Z6831 Body mass index (BMI) 31.0-31.9, adult: Secondary | ICD-10-CM | POA: Diagnosis not present

## 2021-04-24 ENCOUNTER — Other Ambulatory Visit: Payer: Self-pay | Admitting: *Deleted

## 2021-04-24 DIAGNOSIS — O365990 Maternal care for other known or suspected poor fetal growth, unspecified trimester, not applicable or unspecified: Secondary | ICD-10-CM

## 2021-04-25 ENCOUNTER — Ambulatory Visit (INDEPENDENT_AMBULATORY_CARE_PROVIDER_SITE_OTHER): Payer: BC Managed Care – PPO | Admitting: Obstetrics and Gynecology

## 2021-04-25 ENCOUNTER — Encounter: Payer: Self-pay | Admitting: Obstetrics and Gynecology

## 2021-04-25 VITALS — BP 120/79 | HR 99 | Wt 263.0 lb

## 2021-04-25 DIAGNOSIS — B009 Herpesviral infection, unspecified: Secondary | ICD-10-CM | POA: Insufficient documentation

## 2021-04-25 DIAGNOSIS — O0990 Supervision of high risk pregnancy, unspecified, unspecified trimester: Secondary | ICD-10-CM

## 2021-04-25 DIAGNOSIS — Z3A22 22 weeks gestation of pregnancy: Secondary | ICD-10-CM

## 2021-04-25 DIAGNOSIS — O0992 Supervision of high risk pregnancy, unspecified, second trimester: Secondary | ICD-10-CM

## 2021-04-25 DIAGNOSIS — O98312 Other infections with a predominantly sexual mode of transmission complicating pregnancy, second trimester: Secondary | ICD-10-CM

## 2021-04-25 DIAGNOSIS — O09529 Supervision of elderly multigravida, unspecified trimester: Secondary | ICD-10-CM

## 2021-04-25 DIAGNOSIS — Z6837 Body mass index (BMI) 37.0-37.9, adult: Secondary | ICD-10-CM

## 2021-04-25 DIAGNOSIS — O365990 Maternal care for other known or suspected poor fetal growth, unspecified trimester, not applicable or unspecified: Secondary | ICD-10-CM | POA: Insufficient documentation

## 2021-04-25 DIAGNOSIS — O09522 Supervision of elderly multigravida, second trimester: Secondary | ICD-10-CM

## 2021-04-25 DIAGNOSIS — O365920 Maternal care for other known or suspected poor fetal growth, second trimester, not applicable or unspecified: Secondary | ICD-10-CM

## 2021-04-25 DIAGNOSIS — O99212 Obesity complicating pregnancy, second trimester: Secondary | ICD-10-CM

## 2021-04-25 DIAGNOSIS — O99210 Obesity complicating pregnancy, unspecified trimester: Secondary | ICD-10-CM

## 2021-04-25 NOTE — Progress Notes (Signed)
ROB 22wks5d ?Pt had U/S done on 04/23/21 ? ?CC: pt wants to discuss U/S and baby's weight.  ? ? ? ?

## 2021-04-25 NOTE — Progress Notes (Signed)
? ?  PRENATAL VISIT NOTE ? ?Subjective:  ?Sherry [name] is a 36 y.o. G3P1011 at [redacted]w[redacted]d being seen today for ongoing prenatal care.  She is currently monitored for the following issues for this high-risk pregnancy and has Supervision of high risk pregnancy, antepartum; Obesity affecting pregnancy, antepartum; BMI 37.0-37.9, adult; Maternal age 45+, multigravida, antepartum; and Poor fetal growth affecting management of mother on their problem list. ? ?Patient reports no complaints.  Contractions: Irritability. Vag. Bleeding: None.  Movement: Present. Denies leaking of fluid.  ? ?[name], current medications, past family history, past medical history, past social history, past surgical history and problem list.  ? ?Objective:  ? ?Vitals:  ? 04/25/21 1323  ?BP: 120/79  ?Pulse: 99  ?Weight: 263 lb (119.3 kg)  ? ? ?Fetal Status: Fetal Heart Rate (bpm): 148   Movement: Present    ? ?General:  Alert, oriented and cooperative. Patient is in no acute distress.  ?Skin: Skin is warm and dry. No rash noted.   ?Cardiovascular: Normal heart rate noted  ?Respiratory: Normal respiratory effort, no problems with respiration noted  ?Abdomen: Soft, gravid, appropriate for gestational age.  Pain/Pressure: Present     ?Pelvic: Cervical exam deferred        ?Extremities: Normal range of motion.  Edema: None  ?Mental Status: Normal mood and affect. Normal behavior. Normal judgment and thought content.  ? ?Assessment and Plan:  ?Pregnancy: G3P1011 at [redacted]w[redacted]d ?1. Supervision of high risk pregnancy, antepartum ? ?2. Maternal age 50+, multigravida, antepartum ? ?3. Poor fetal growth affecting management of mother in second trimester, single or unspecified fetus ?Borderline fgr on 4/11. Negative cffdna and afp. Has repeat growth in 3wks ?4/11: 10%, ac 8%, mvp 4.8, ua wnl ? ?4. Obesity affecting pregnancy, antepartum ? ?5. BMI 37.0-37.9, adult ? ?Preterm  labor symptoms and general obstetric precautions including but not limited to vaginal bleeding, contractions, leaking of fluid and fetal movement were reviewed in detail with the patient. ?Please refer to After Visit Summary for other counseling recommendations.  ? ?No follow-ups on file. ? ?Future Appointments  ?Date Time Provider [place]  ?05/14/2021  3:30 PM [facility] NURSE [facility]  ?05/14/2021  3:45 PM [facility] US1 [facility]  ?05/23/2021  8:15 AM Anyanwu, Sallyanne Havers, MD [facility]  ?06/06/2021  4:10 PM Anyanwu, Sallyanne Havers, MD [facility]  ? ? ?Aletha Halim, MD ? ?

## 2021-05-01 ENCOUNTER — Encounter: Payer: BC Managed Care – PPO | Admitting: Obstetrics and Gynecology

## 2021-05-01 DIAGNOSIS — Z638 Other specified problems related to primary support group: Secondary | ICD-10-CM | POA: Diagnosis not present

## 2021-05-01 DIAGNOSIS — F411 Generalized anxiety disorder: Secondary | ICD-10-CM | POA: Diagnosis not present

## 2021-05-14 ENCOUNTER — Ambulatory Visit: Payer: BC Managed Care – PPO | Admitting: *Deleted

## 2021-05-14 ENCOUNTER — Ambulatory Visit: Payer: BC Managed Care – PPO | Attending: Obstetrics

## 2021-05-14 VITALS — BP 118/67 | HR 96

## 2021-05-14 DIAGNOSIS — O0990 Supervision of high risk pregnancy, unspecified, unspecified trimester: Secondary | ICD-10-CM | POA: Diagnosis not present

## 2021-05-14 DIAGNOSIS — O365920 Maternal care for other known or suspected poor fetal growth, second trimester, not applicable or unspecified: Secondary | ICD-10-CM | POA: Diagnosis not present

## 2021-05-14 DIAGNOSIS — E669 Obesity, unspecified: Secondary | ICD-10-CM

## 2021-05-14 DIAGNOSIS — Z6837 Body mass index (BMI) 37.0-37.9, adult: Secondary | ICD-10-CM | POA: Diagnosis not present

## 2021-05-14 DIAGNOSIS — O09529 Supervision of elderly multigravida, unspecified trimester: Secondary | ICD-10-CM | POA: Insufficient documentation

## 2021-05-14 DIAGNOSIS — O365990 Maternal care for other known or suspected poor fetal growth, unspecified trimester, not applicable or unspecified: Secondary | ICD-10-CM | POA: Insufficient documentation

## 2021-05-14 DIAGNOSIS — Z3A25 25 weeks gestation of pregnancy: Secondary | ICD-10-CM

## 2021-05-14 DIAGNOSIS — Z362 Encounter for other antenatal screening follow-up: Secondary | ICD-10-CM | POA: Diagnosis not present

## 2021-05-14 DIAGNOSIS — O99210 Obesity complicating pregnancy, unspecified trimester: Secondary | ICD-10-CM | POA: Insufficient documentation

## 2021-05-14 DIAGNOSIS — O99212 Obesity complicating pregnancy, second trimester: Secondary | ICD-10-CM | POA: Diagnosis not present

## 2021-05-15 ENCOUNTER — Other Ambulatory Visit: Payer: Self-pay | Admitting: *Deleted

## 2021-05-15 DIAGNOSIS — O365990 Maternal care for other known or suspected poor fetal growth, unspecified trimester, not applicable or unspecified: Secondary | ICD-10-CM

## 2021-05-15 DIAGNOSIS — O09522 Supervision of elderly multigravida, second trimester: Secondary | ICD-10-CM

## 2021-05-15 DIAGNOSIS — Z362 Encounter for other antenatal screening follow-up: Secondary | ICD-10-CM

## 2021-05-15 DIAGNOSIS — O99212 Obesity complicating pregnancy, second trimester: Secondary | ICD-10-CM

## 2021-05-21 DIAGNOSIS — Z638 Other specified problems related to primary support group: Secondary | ICD-10-CM | POA: Diagnosis not present

## 2021-05-21 DIAGNOSIS — F411 Generalized anxiety disorder: Secondary | ICD-10-CM | POA: Diagnosis not present

## 2021-05-23 ENCOUNTER — Ambulatory Visit (INDEPENDENT_AMBULATORY_CARE_PROVIDER_SITE_OTHER): Payer: BC Managed Care – PPO | Admitting: Obstetrics & Gynecology

## 2021-05-23 ENCOUNTER — Encounter: Payer: Self-pay | Admitting: Obstetrics & Gynecology

## 2021-05-23 VITALS — BP 124/85 | HR 92 | Wt 273.0 lb

## 2021-05-23 DIAGNOSIS — O99210 Obesity complicating pregnancy, unspecified trimester: Secondary | ICD-10-CM

## 2021-05-23 DIAGNOSIS — O0992 Supervision of high risk pregnancy, unspecified, second trimester: Secondary | ICD-10-CM

## 2021-05-23 DIAGNOSIS — O0990 Supervision of high risk pregnancy, unspecified, unspecified trimester: Secondary | ICD-10-CM

## 2021-05-23 DIAGNOSIS — O09522 Supervision of elderly multigravida, second trimester: Secondary | ICD-10-CM

## 2021-05-23 DIAGNOSIS — O99212 Obesity complicating pregnancy, second trimester: Secondary | ICD-10-CM

## 2021-05-23 DIAGNOSIS — O09529 Supervision of elderly multigravida, unspecified trimester: Secondary | ICD-10-CM

## 2021-05-23 DIAGNOSIS — Z3A26 26 weeks gestation of pregnancy: Secondary | ICD-10-CM

## 2021-05-23 DIAGNOSIS — O365920 Maternal care for other known or suspected poor fetal growth, second trimester, not applicable or unspecified: Secondary | ICD-10-CM

## 2021-05-23 NOTE — Patient Instructions (Signed)
Return to office for any scheduled appointments. Call the office or go to the MAU at [facility] if: You begin to have strong, frequent contractions Your water breaks.  Sometimes it is a big gush of fluid, sometimes it is just a trickle that keeps getting your underwear wet or running down your legs You have vaginal bleeding.  It is normal to have a small amount of spotting if your cervix was checked.  You do not feel your baby moving like normal.  If you do not, get something to eat and drink and lay down and focus on feeling your baby move.   If your baby is still not moving like normal, you should call the office or go to MAU. Any other obstetric concerns.   TDaP Vaccine Pregnancy Get the Whooping Cough Vaccine While You Are Pregnant (CDC)  It is important for women to get the whooping cough vaccine in the third trimester of each pregnancy. Vaccines are the best way to prevent this disease. There are 2 different whooping cough vaccines. Both vaccines combine protection against whooping cough, tetanus and diphtheria, but they are for different age groups: Tdap: for everyone 11 years or older, including pregnant women  DTaP: for children 2 months through 6 years of age  You need the whooping cough vaccine during each of your pregnancies The recommended time to get the shot is during your 27th through 36th week of pregnancy, preferably during the earlier part of this time period. The Centers for Disease Control and Prevention (CDC) recommends that pregnant women receive the whooping cough vaccine for adolescents and adults (called Tdap vaccine) during the third trimester of each pregnancy. The recommended time to get the shot is during your 27th through 36th week of pregnancy, preferably during the earlier part of this time period. This replaces the original recommendation that pregnant women get the vaccine only if they had not previously received it. The American  College of Obstetricians and Gynecologists and the American College of Nurse-Midwives support this recommendation.  You should get the whooping cough vaccine while pregnant to pass protection to your baby frame support disabled and/or not supported in this browser  Learn why Laura decided to get the whooping cough vaccine in her 3rd trimester of pregnancy and how her baby girl was born with some protection against the disease. Also available on YouTube. After receiving the whooping cough vaccine, your body will create protective antibodies (proteins produced by the body to fight off diseases) and pass some of them to your baby before birth. These antibodies provide your baby some short-term protection against whooping cough in early life. These antibodies can also protect your baby from some of the more serious complications that come along with whooping cough. Your protective antibodies are at their highest about 2 weeks after getting the vaccine, but it takes time to pass them to your baby. So the preferred time to get the whooping cough vaccine is early in your third trimester. The amount of whooping cough antibodies in your body decreases over time. That is why CDC recommends you get a whooping cough vaccine during each pregnancy. Doing so allows each of your babies to get the greatest number of protective antibodies from you. This means each of your babies will get the best protection possible against this disease.  Getting the whooping cough vaccine while pregnant is better than getting the vaccine after you give birth Whooping cough vaccination during pregnancy is ideal so   your baby will have short-term protection as soon as he is born. This early protection is important because your baby will not start getting his whooping cough vaccines until he is 2 months old. These first few months of life are when your baby is at greatest risk for catching whooping cough. This is also when he's at greatest  risk for having severe, potentially life-threating complications from the infection. To avoid that gap in protection, it is best to get a whooping cough vaccine during pregnancy. You will then pass protection to your baby before he is born. To continue protecting your baby, he should get whooping cough vaccines starting at 2 months old. You may never have gotten the Tdap vaccine before and did not get it during this pregnancy. If so, you should make sure to get the vaccine immediately after you give birth, before leaving the hospital or birthing center. It will take about 2 weeks before your body develops protection (antibodies) in response to the vaccine. Once you have protection from the vaccine, you are less likely to give whooping cough to your newborn while caring for him. But remember, your baby will still be at risk for catching whooping cough from others. A recent study looked to see how effective Tdap was at preventing whooping cough in babies whose mothers got the vaccine while pregnant or in the hospital after giving birth. The study found that getting Tdap between 27 through 36 weeks of pregnancy is 85% more effective at preventing whooping cough in babies younger than 2 months old. Blood tests cannot tell if you need a whooping cough vaccine There are no blood tests that can tell you if you have enough antibodies in your body to protect yourself or your baby against whooping cough. Even if you have been sick with whooping cough in the past or previously received the vaccine, you still should get the vaccine during each pregnancy. Breastfeeding may pass some protective antibodies onto your baby By breastfeeding, you may pass some antibodies you have made in response to the vaccine to your baby. When you get a whooping cough vaccine during your pregnancy, you will have antibodies in your breast milk that you can share with your baby as soon as your milk comes in. However, your baby will not get  protective antibodies immediately if you wait to get the whooping cough vaccine until after delivering your baby. This is because it takes about 2 weeks for your body to create antibodies. Learn more about the health benefits of breastfeeding.  

## 2021-05-23 NOTE — Progress Notes (Signed)
? ?PRENATAL VISIT NOTE ? ?Subjective:  ?Sherry [name] is a 36 y.o. G3P1011 at [redacted]w[redacted]d being seen today for ongoing prenatal care.  She is currently monitored for the following issues for this high-risk pregnancy and has Supervision of high risk pregnancy, antepartum; Obesity affecting pregnancy, antepartum; BMI 37.0-37.9, adult; Maternal age 18+, multigravida, antepartum; Poor fetal growth affecting management of mother; and Herpes on their problem list. ? ?Patient reports no complaints.  Contractions: Irritability. Vag. Bleeding: None.  Movement: Present. Denies leaking of fluid.  ? ?[name], current medications, past family history, past medical history, past social history, past surgical history and problem list.  ? ?Objective:  ? ?Vitals:  ? 05/23/21 0838  ?BP: 124/85  ?Pulse: 92  ?Weight: 273 lb (123.8 kg)  ? ? ?Fetal Status: Fetal Heart Rate (bpm): 144   Movement: Present    ? ?General:  Alert, oriented and cooperative. Patient is in no acute distress.  ?Skin: Skin is warm and dry. No rash noted.   ?Cardiovascular: Normal heart rate noted  ?Respiratory: Normal respiratory effort, no problems with respiration noted  ?Abdomen: Soft, gravid, appropriate for gestational age.  Pain/Pressure: Absent     ?Pelvic: Cervical exam deferred        ?Extremities: Normal range of motion.  Edema: None  ?Mental Status: Normal mood and affect. Normal behavior. Normal judgment and thought content.  ? ?Imaging: ?[place] MFM OB FOLLOW UP ? ?Result Date: 05/14/2021 ?----------------------------------------------------------------------  OBSTETRICS REPORT                       (Signed Final 05/14/2021 04:30 pm) ---------------------------------------------------------------------- Patient Info  ID #:       [id number]                          D.O.B.:  November 20, 1985 (35 yrs)  Name:       Sherry [name]                   Visit Date: 05/14/2021 03:39 pm  ---------------------------------------------------------------------- Performed By  Attending:        Sander Nephew      Ref. Address:     [address]                    [place], [place]  Performed By:     Valda Favia          [facility]  Referred By:      Laury Deep CNM ---------------------------------------------------------------------- Orders  #  Description                           Code        Ordered By  1  [place] MFM OB FOLLOW UP                   [phone]    YU FANG ----------------------------------------------------------------------  #  Order #                     Accession #                Episode #  1  [id number]                   [id number]                 [id number] ---------------------------------------------------------------------- Indications  Advanced maternal age multigravida 3+,        O47.522  second trimester  [redacted] weeks gestation of pregnancy                [id number]  Obesity complicating pregnancy, second         O99.212  trimester (pregravid BMI 35)  Antenatal follow-up for nonvisualized fetal    Z36.2  anatomy  LR NIPS, Neg Horizon  Small for gestational age fetus affecting      O68.5990  management of mother ---------------------------------------------------------------------- Fetal Evaluation  Num Of Fetuses:         1  Fetal Heart Rate(bpm):  129  Cardiac Activity:       Observed  Presentation:           Breech  Placenta:               Anterior  P. Cord Insertion:      Previously Visualized  Amniotic Fluid  AFI FV:      Within  normal limits  Largest Pocket(cm)                              4.8 ---------------------------------------------------------------------- Biometry  BPD:      58.7  mm     G. Age:  24w 0d          6  %    CI:        71.03   %    70 - 86                                                          FL/HC:      20.5   %    18.7 - 20.3  HC:      221.9  mm     G. Age:  24w 2d        3.5  %    HC/AC:      1.10        1.04 - 1.22  AC:       201   mm     G. Age:  24w 5d         21  %    FL/BPD:     77.3   %    71 - 87  FL:       45.4  mm     G. Age:  25w 0d         24  %    FL/AC:      22.6   %    20 - 24  HUM:      42.7  mm     G. Age:  25w 4d         48  %  Est. FW:     730  gm    1 lb 10 oz      15  % ---------------------------------------------------------------------- OB History  Gravidity:    3         Term:   0        Prem:   0        SAB:   1  TOP:          0       Ectopic:  0        Living: 1 ---------------------------------------------------------------------- Gestational Age  LMP:           25w 3d        Date:  11/17/20                 EDD:   08/24/21  U/S Today:     24w 4d                                        EDD:   08/30/21  Best:          25w 3d     Det. By:  LMP  (11/17/20)          EDD:   08/24/21 ---------------------------------------------------------------------- Anatomy  Cranium:               Appears normal         Aortic  Arch:            Previously seen  Cavum:                 Previously seen        Ductal Arch:            Previously seen  Ventricles:            Appears normal         Diaphragm:              Previously seen  Choroid Plexus:        Previously seen        Stomach:                Appears normal, left                                                                        sided  Cerebellum:            Previously seen        Abdomen:                Previously seen  Posterior Fossa:       Previously seen        Abdominal Wall:         Previously seen  Nuchal Fold:            Previously seen        Cord Vessels:           Previously seen  Face:                  Orbits and profile     Kidneys:                Appear normal                         previously seen  Lips:                  Previously seen        Bladder:                Appears normal  Thoracic:              Appears normal         Spine:                  Previously seen  Heart:                 Appears normal         Upper Extremities:      Previously seen                         (4CH, axis, and                         situs)  RVOT:                  Not well visualized    Lower Extremities:      Previously seen  LVOT:  Not well visualized  Other:  Heels, 3VV, nasal bone, lenses and falx visualized prev. Technically          difficult due to early gestational age and maternal body habitus. Fetus          appears to be female. Parents do not wish to know sex. ---------------------------------------------------------------------- Cervix Uterus Adnexa  Cervix  Not visualized (advanced GA >24wks)  Uterus  No abnormality visualized.  Right Ovary  No adnexal mass visualized.  Left Ovary  No adnexal mass visualized.  Cul De Sac  No free fluid seen.  Adnexa  No abnormality visualized. ---------------------------------------------------------------------- Impression  Follow up growth to complete the fetal anatomy and due to  small for gestation.  Normal interval growth with measurements consistent with  dates  Good fetal movement and amniotic fluid volume  Suboptimal views of the fetal anatomy was again seen  secondary to fetal position. ---------------------------------------------------------------------- Recommendations  Repeat growth in 4 weeks. ----------------------------------------------------------------------               Sander Nephew, MD Electronically Signed Final Report   05/14/2021 04:30 pm ---------------------------------------------------------------------- ? ?[place] MFM OB FOLLOW UP ? ?Result  Date: 04/23/2021 ?----------------------------------------------------------------------  OBSTETRICS REPORT                       (Signed Final 04/23/2021 05:09 pm) ------------------------------------------------

## 2021-05-24 LAB — RPR: RPR Ser Ql: NONREACTIVE

## 2021-05-24 LAB — GLUCOSE TOLERANCE, 2 HOURS W/ 1HR
Glucose, 1 hour: 151 mg/dL (ref 70–179)
Glucose, 2 hour: 121 mg/dL (ref 70–152)
Glucose, Fasting: 84 mg/dL (ref 70–91)

## 2021-05-24 LAB — CBC
Hematocrit: 35.7 % (ref 34.0–46.6)
Hemoglobin: 11.9 g/dL (ref 11.1–15.9)
MCH: 27.9 pg (ref 26.6–33.0)
MCHC: 33.3 g/dL (ref 31.5–35.7)
MCV: 84 fL (ref 79–97)
Platelets: 354 10*3/uL (ref 150–450)
RBC: 4.26 x10E6/uL (ref 3.77–5.28)
RDW: 13.4 % (ref 11.7–15.4)
WBC: 10.6 10*3/uL (ref 3.4–10.8)

## 2021-05-24 LAB — HIV ANTIBODY (ROUTINE TESTING W REFLEX): HIV Screen 4th Generation wRfx: NONREACTIVE

## 2021-05-25 ENCOUNTER — Encounter: Payer: Self-pay | Admitting: Obstetrics and Gynecology

## 2021-05-27 DIAGNOSIS — F411 Generalized anxiety disorder: Secondary | ICD-10-CM | POA: Diagnosis not present

## 2021-05-27 DIAGNOSIS — Z638 Other specified problems related to primary support group: Secondary | ICD-10-CM | POA: Diagnosis not present

## 2021-05-31 ENCOUNTER — Encounter: Payer: Self-pay | Admitting: *Deleted

## 2021-06-06 ENCOUNTER — Encounter: Payer: Self-pay | Admitting: Obstetrics & Gynecology

## 2021-06-06 ENCOUNTER — Other Ambulatory Visit (HOSPITAL_COMMUNITY)
Admission: RE | Admit: 2021-06-06 | Discharge: 2021-06-06 | Disposition: A | Discharge status: Home / Self Care | Payer: BC Managed Care – PPO | Source: Ambulatory Visit | Attending: Obstetrics & Gynecology | Admitting: Obstetrics & Gynecology

## 2021-06-06 ENCOUNTER — Ambulatory Visit (INDEPENDENT_AMBULATORY_CARE_PROVIDER_SITE_OTHER): Payer: BC Managed Care – PPO | Admitting: Obstetrics & Gynecology

## 2021-06-06 VITALS — BP 129/84 | HR 101 | Wt 278.4 lb

## 2021-06-06 DIAGNOSIS — Z3A00 Weeks of gestation of pregnancy not specified: Secondary | ICD-10-CM | POA: Insufficient documentation

## 2021-06-06 DIAGNOSIS — B3731 Acute candidiasis of vulva and vagina: Secondary | ICD-10-CM | POA: Insufficient documentation

## 2021-06-06 DIAGNOSIS — O09529 Supervision of elderly multigravida, unspecified trimester: Secondary | ICD-10-CM

## 2021-06-06 DIAGNOSIS — N760 Acute vaginitis: Secondary | ICD-10-CM | POA: Diagnosis not present

## 2021-06-06 DIAGNOSIS — Z23 Encounter for immunization: Secondary | ICD-10-CM

## 2021-06-06 DIAGNOSIS — O1203 Gestational edema, third trimester: Secondary | ICD-10-CM

## 2021-06-06 DIAGNOSIS — O23593 Infection of other part of genital tract in pregnancy, third trimester: Secondary | ICD-10-CM

## 2021-06-06 DIAGNOSIS — Z3A28 28 weeks gestation of pregnancy: Secondary | ICD-10-CM

## 2021-06-06 DIAGNOSIS — O09523 Supervision of elderly multigravida, third trimester: Secondary | ICD-10-CM

## 2021-06-06 DIAGNOSIS — O365930 Maternal care for other known or suspected poor fetal growth, third trimester, not applicable or unspecified: Secondary | ICD-10-CM

## 2021-06-06 DIAGNOSIS — O0993 Supervision of high risk pregnancy, unspecified, third trimester: Secondary | ICD-10-CM

## 2021-06-06 DIAGNOSIS — O0990 Supervision of high risk pregnancy, unspecified, unspecified trimester: Secondary | ICD-10-CM

## 2021-06-06 LAB — POCT URINALYSIS DIPSTICK
Bilirubin, UA: NEGATIVE
Blood, UA: NEGATIVE
Glucose, UA: NEGATIVE
Ketones, UA: NEGATIVE
Leukocytes, UA: NEGATIVE
Nitrite, UA: NEGATIVE
Odor: NEGATIVE
Protein, UA: NEGATIVE
Spec Grav, UA: 1.01 (ref 1.010–1.025)
Urobilinogen, UA: 0.2 E.U./dL
pH, UA: 7 (ref 5.0–8.0)

## 2021-06-06 NOTE — Progress Notes (Signed)
PRENATAL VISIT NOTE  Subjective:  Sherry [name] is a 36 y.o. G3P1011 at [redacted]w[redacted]d being seen today for ongoing prenatal care.  She is currently monitored for the following issues for this high-risk pregnancy and has Supervision of high risk pregnancy, antepartum; Obesity affecting pregnancy, antepartum; BMI 37.0-37.9, adult; Maternal age 70+, multigravida, antepartum; Poor fetal growth affecting management of mother; and Herpes on their problem list.  Patient reports  periodic lower extremity swelling .  Also reports having vaginal irritation.  Patient denies any headaches, visual symptoms, RUQ/epigastric pain or other concerning symptoms. Had some elevated BP last week.  Contractions: Irritability. Vag. Bleeding: None.  Movement: Present. Denies leaking of fluid.   [name], current medications, past family history, past medical history, past social history, past surgical history and problem list.   Objective:   Vitals:   06/06/21 1617  BP: 129/84  Pulse: (!) 101  Weight: 278 lb 6.4 oz (126.3 kg)    Fetal Status: Fetal Heart Rate (bpm): 140   Movement: Present     General:  Alert, oriented and cooperative. Patient is in no acute distress.  Skin: Skin is warm and dry. No rash noted.   Cardiovascular: Normal heart rate noted  Respiratory: Normal respiratory effort, no problems with respiration noted  Abdomen: Soft, gravid, appropriate for gestational age.  Pain/Pressure: Present     Pelvic: Cervical exam deferred        Extremities: Normal range of motion.  Edema: None  Mental Status: Normal mood and affect. Normal behavior. Normal judgment and thought content.   Results for orders placed or performed in visit on 06/06/21 (from the past 672 hour(s))  POCT Urinalysis Dipstick   Collection Time: 06/06/21  4:30 PM  Result Value Ref Range   Color, UA yellow    Clarity, UA cloudy    Glucose, UA  Negative Negative   Bilirubin, UA negative    Ketones, UA negative    Spec Grav, UA 1.010 1.010 - 1.025   Blood, UA negative    pH, UA 7.0 5.0 - 8.0   Protein, UA Negative Negative   Urobilinogen, UA 0.2 0.2 or 1.0 E.U./dL   Nitrite, UA negative    Leukocytes, UA Negative Negative   Appearance     Odor NEGATIVE   Results for orders placed or performed in visit on 05/23/21 (from the past 672 hour(s))  Glucose Tolerance, 2 Hours w/1 Hour   Collection Time: 05/23/21  8:49 AM  Result Value Ref Range   Glucose, Fasting 84 70 - 91 mg/dL   Glucose, 1 hour 151 70 - 179 mg/dL   Glucose, 2 hour 121 70 - 152 mg/dL  CBC   Collection Time: 05/23/21  8:49 AM  Result Value Ref Range   WBC 10.6 3.4 - 10.8 x10E3/uL   RBC 4.26 3.77 - 5.28 x10E6/uL   Hemoglobin 11.9 11.1 - 15.9 g/dL   Hematocrit 35.7 34.0 - 46.6 %   MCV 84 79 - 97 fL   MCH 27.9 26.6 - 33.0 pg   MCHC 33.3 31.5 - 35.7 g/dL   RDW 13.4 11.7 - 15.4 %   Platelets 354 150 - 450 x10E3/uL  RPR   Collection Time: 05/23/21  8:49 AM  Result Value Ref Range   RPR Ser Ql Non Reactive Non Reactive  HIV Antibody (routine testing w rflx)   Collection Time: 05/23/21  8:49 AM  Result Value Ref Range  HIV Screen 4th Generation wRfx Non Reactive Non Reactive   [place] MFM OB FOLLOW UP  Result Date: 05/14/2021 ----------------------------------------------------------------------  OBSTETRICS REPORT                       (Signed Final 05/14/2021 04:30 pm) ---------------------------------------------------------------------- Patient Info  ID #:       [id number]                          D.O.B.:  17-Aug-1985 (35 yrs)  Name:       Sherry [name]                   Visit Date: 05/14/2021 03:39 pm ---------------------------------------------------------------------- Performed By  Attending:        Sander Nephew      Ref. Address:     [address]                    [place], [place]  Performed By:     Valda Favia          [facility]                                                             [facility]                                                             [facility]  Referred By:      Darrold Span  DAWSON CNM ---------------------------------------------------------------------- Orders  #  Description                           Code        Ordered By  1  [place] MFM OB FOLLOW UP                   [phone]    YU FANG ----------------------------------------------------------------------  #  Order #                     Accession #                Episode #  1  [id number]                   [id number]                 [id number] ---------------------------------------------------------------------- Indications  Advanced maternal age multigravida 11+,        O18.522  second trimester  [redacted] weeks gestation of pregnancy                [id number]  Obesity complicating pregnancy, second         O99.212  trimester (pregravid BMI 35)  Antenatal follow-up for nonvisualized fetal    Z36.2  anatomy  LR NIPS, Neg Horizon  Small for gestational age fetus affecting      O57.5990  management of mother ---------------------------------------------------------------------- Fetal Evaluation  Num Of Fetuses:         1  Fetal Heart Rate(bpm):  129  Cardiac Activity:       Observed  Presentation:           Breech  Placenta:               Anterior  P. Cord Insertion:      Previously Visualized  Amniotic Fluid  AFI FV:      Within normal limits                              Largest Pocket(cm)                              4.8 ---------------------------------------------------------------------- Biometry  BPD:      58.7  mm     G. Age:  24w 0d          6  %    CI:        71.03   %    70 - 86                                                           FL/HC:      20.5   %    18.7 - 20.3  HC:      221.9  mm     G. Age:  24w 2d        3.5  %    HC/AC:      1.10        1.04 - 1.22  AC:       201   mm     G. Age:  24w  5d         21  %    FL/BPD:     77.3   %    71 - 87  FL:       45.4  mm     G. Age:  25w 0d         24  %    FL/AC:      22.6   %    20 - 24  HUM:      42.7  mm     G. Age:  25w 4d         48  %  Est. FW:     730  gm    1 lb 10 oz      15  % ---------------------------------------------------------------------- OB History  Gravidity:    3         Term:   0        Prem:   0        SAB:   1  TOP:          0       Ectopic:  0        Living: 1 ---------------------------------------------------------------------- Gestational Age  LMP:           25w 3d        Date:  11/17/20                 EDD:   08/24/21  U/S Today:     24w 4d                                        EDD:   08/30/21  Best:          25w 3d     Det. By:  LMP  (11/17/20)          EDD:   08/24/21 ---------------------------------------------------------------------- Anatomy  Cranium:               Appears normal         Aortic Arch:            Previously seen  Cavum:                 Previously seen        Ductal Arch:            Previously seen  Ventricles:            Appears normal         Diaphragm:              Previously seen  Choroid Plexus:        Previously seen        Stomach:                Appears normal, left                                                                        sided  Cerebellum:            Previously seen        Abdomen:  Previously seen  Posterior Fossa:       Previously seen        Abdominal Wall:         Previously seen  Nuchal Fold:           Previously seen        Cord Vessels:           Previously seen  Face:                  Orbits and profile     Kidneys:                Appear normal                         previously seen  Lips:                  Previously seen        Bladder:                Appears  normal  Thoracic:              Appears normal         Spine:                  Previously seen  Heart:                 Appears normal         Upper Extremities:      Previously seen                         (4CH, axis, and                         situs)  RVOT:                  Not well visualized    Lower Extremities:      Previously seen  LVOT:                  Not well visualized  Other:  Heels, 3VV, nasal bone, lenses and falx visualized prev. Technically          difficult due to early gestational age and maternal body habitus. Fetus          appears to be female. Parents do not wish to know sex. ---------------------------------------------------------------------- Cervix Uterus Adnexa  Cervix  Not visualized (advanced GA >24wks)  Uterus  No abnormality visualized.  Right Ovary  No adnexal mass visualized.  Left Ovary  No adnexal mass visualized.  Cul De Sac  No free fluid seen.  Adnexa  No abnormality visualized. ---------------------------------------------------------------------- Impression  Follow up growth to complete the fetal anatomy and due to  small for gestation.  Normal interval growth with measurements consistent with  dates  Good fetal movement and amniotic fluid volume  Suboptimal views of the fetal anatomy was again seen  secondary to fetal position. ---------------------------------------------------------------------- Recommendations  Repeat growth in 4 weeks. ----------------------------------------------------------------------               Sander Nephew, MD Electronically Signed Final Report   05/14/2021 04:30 pm ----------------------------------------------------------------------   Assessment and Plan:  Pregnancy: [id number] at [redacted]w[redacted]d 1. Small for gestational age fetus affecting management of mother, third trimester Continue scans as per MFM and follow up recommendations  2. Vaginitis affecting pregnancy  in third trimester, antepartum Self-swab done. Will follow up results and  manage accordingly. - Cervicovaginal ancillary only( [place])  3. Edema during pregnancy in third trimester Normal BP, no PEC symptoms. Negative proteinuria today. Patient reassured about edema that can happen at this point in pregnancy with prolonged staying in one position. Accomodations letter written for her work to provide her with an adjustable standing desk.  - POCT Urinalysis Dipstick  4. Maternal age 68+, multigravida, antepartum 5. [redacted] weeks gestation of pregnancy 6. Supervision of high risk pregnancy, antepartum Normal third trimester labs reviewed with patient. Tdap given today. - Tdap vaccine greater than or equal to 7yo IM Preterm labor symptoms and general obstetric precautions including but not limited to vaginal bleeding, contractions, leaking of fluid and fetal movement were reviewed in detail with the patient. Please refer to After Visit Summary for other counseling recommendations.   Return in about 2 weeks (around 06/20/2021) for OFFICE OB VISIT (MD only).  Future Appointments  Date Time Provider [place]  06/12/2021  3:30 PM [facility] NURSE [facility]  06/12/2021  3:45 PM [facility]  06/20/2021  1:30 PM Aletha Halim, MD [facility]  07/04/2021  1:30 PM Aletha Halim, MD [facility]  07/11/2021  1:30 PM Darlina Rumpf, CNM [facility]    Verita Schneiders, MD

## 2021-06-08 ENCOUNTER — Encounter: Payer: Self-pay | Admitting: Obstetrics & Gynecology

## 2021-06-11 ENCOUNTER — Other Ambulatory Visit: Payer: Self-pay

## 2021-06-11 ENCOUNTER — Telehealth: Payer: Self-pay

## 2021-06-11 ENCOUNTER — Other Ambulatory Visit: Payer: Self-pay | Admitting: Obstetrics and Gynecology

## 2021-06-11 DIAGNOSIS — N898 Other specified noninflammatory disorders of vagina: Secondary | ICD-10-CM

## 2021-06-11 LAB — CERVICOVAGINAL ANCILLARY ONLY
Bacterial Vaginitis (gardnerella): NEGATIVE
Candida Glabrata: NEGATIVE
Candida Vaginitis: POSITIVE — AB
Chlamydia: NEGATIVE
Comment: NEGATIVE
Comment: NEGATIVE
Comment: NEGATIVE
Comment: NEGATIVE
Comment: NEGATIVE
Comment: NORMAL
Neisseria Gonorrhea: NEGATIVE
Trichomonas: NEGATIVE

## 2021-06-11 MED ORDER — METRONIDAZOLE 0.75 % VA GEL: 1.0000 | Freq: Two times a day (BID) | VAGINAL | 0 refills | Status: DC

## 2021-06-11 MED ORDER — TERCONAZOLE 0.8 % VA CREA: 1.0000 | TOPICAL_CREAM | Freq: Every day | VAGINAL | 0 refills | Status: DC

## 2021-06-11 NOTE — Telephone Encounter (Signed)
Return call to patient regarding triage message pt c/o vaginal irritation and wants Rx. However results from vaginal swab were not back yet. Rx's sent per pt request for BV and yeast. Pt agreeable and voiced understanding.

## 2021-06-12 ENCOUNTER — Ambulatory Visit: Payer: BC Managed Care – PPO | Admitting: *Deleted

## 2021-06-12 ENCOUNTER — Ambulatory Visit: Payer: BC Managed Care – PPO | Attending: Maternal & Fetal Medicine

## 2021-06-12 VITALS — BP 127/76 | HR 99

## 2021-06-12 DIAGNOSIS — O99213 Obesity complicating pregnancy, third trimester: Secondary | ICD-10-CM

## 2021-06-12 DIAGNOSIS — O365990 Maternal care for other known or suspected poor fetal growth, unspecified trimester, not applicable or unspecified: Secondary | ICD-10-CM | POA: Diagnosis not present

## 2021-06-12 DIAGNOSIS — O0990 Supervision of high risk pregnancy, unspecified, unspecified trimester: Secondary | ICD-10-CM | POA: Diagnosis not present

## 2021-06-12 DIAGNOSIS — O99212 Obesity complicating pregnancy, second trimester: Secondary | ICD-10-CM | POA: Diagnosis not present

## 2021-06-12 DIAGNOSIS — Z362 Encounter for other antenatal screening follow-up: Secondary | ICD-10-CM

## 2021-06-12 DIAGNOSIS — O09523 Supervision of elderly multigravida, third trimester: Secondary | ICD-10-CM | POA: Diagnosis not present

## 2021-06-12 DIAGNOSIS — Z3A29 29 weeks gestation of pregnancy: Secondary | ICD-10-CM

## 2021-06-12 DIAGNOSIS — O365930 Maternal care for other known or suspected poor fetal growth, third trimester, not applicable or unspecified: Secondary | ICD-10-CM

## 2021-06-12 DIAGNOSIS — O99210 Obesity complicating pregnancy, unspecified trimester: Secondary | ICD-10-CM

## 2021-06-12 DIAGNOSIS — E669 Obesity, unspecified: Secondary | ICD-10-CM

## 2021-06-12 DIAGNOSIS — O09529 Supervision of elderly multigravida, unspecified trimester: Secondary | ICD-10-CM | POA: Diagnosis not present

## 2021-06-12 DIAGNOSIS — Z6837 Body mass index (BMI) 37.0-37.9, adult: Secondary | ICD-10-CM

## 2021-06-12 DIAGNOSIS — O09522 Supervision of elderly multigravida, second trimester: Secondary | ICD-10-CM

## 2021-06-18 DIAGNOSIS — F411 Generalized anxiety disorder: Secondary | ICD-10-CM | POA: Diagnosis not present

## 2021-06-18 DIAGNOSIS — Z638 Other specified problems related to primary support group: Secondary | ICD-10-CM | POA: Diagnosis not present

## 2021-06-20 ENCOUNTER — Ambulatory Visit (INDEPENDENT_AMBULATORY_CARE_PROVIDER_SITE_OTHER): Payer: BC Managed Care – PPO | Admitting: Obstetrics and Gynecology

## 2021-06-20 VITALS — BP 126/77 | HR 106 | Wt 281.0 lb

## 2021-06-20 DIAGNOSIS — O365930 Maternal care for other known or suspected poor fetal growth, third trimester, not applicable or unspecified: Secondary | ICD-10-CM

## 2021-06-20 DIAGNOSIS — Z6837 Body mass index (BMI) 37.0-37.9, adult: Secondary | ICD-10-CM

## 2021-06-20 DIAGNOSIS — O0990 Supervision of high risk pregnancy, unspecified, unspecified trimester: Secondary | ICD-10-CM

## 2021-06-20 DIAGNOSIS — O09529 Supervision of elderly multigravida, unspecified trimester: Secondary | ICD-10-CM

## 2021-06-20 DIAGNOSIS — Z3A30 30 weeks gestation of pregnancy: Secondary | ICD-10-CM

## 2021-06-20 DIAGNOSIS — O99210 Obesity complicating pregnancy, unspecified trimester: Secondary | ICD-10-CM

## 2021-06-20 NOTE — Progress Notes (Signed)
ROB [redacted]w[redacted]d  Pt recently finished Metrogel Tuesday.  CC:  groin pain 7/10 on right side only  Notes limping when first start to walk.  Swelling has improved.  Recent issue of fleas at home from cat pt has bites.

## 2021-06-20 NOTE — Progress Notes (Signed)
   PRENATAL VISIT NOTE  Subjective:  Sherry [name] is a 36 y.o. G3P1011 at [redacted]w[redacted]d being seen today for ongoing prenatal care.  She is currently monitored for the following issues for this low-risk pregnancy and has Supervision of high risk pregnancy, antepartum; Obesity affecting pregnancy, antepartum; BMI 37.0-37.9, adult; Maternal age 42+, multigravida, antepartum; Poor fetal growth affecting management of mother; and Herpes on their problem list.  Patient reports  right belly discomfort .  Contractions: Not present.  .  Movement: Present. Denies leaking of fluid.   [name], current medications, past family history, past medical history, past social history, past surgical history and problem list.   Objective:   Vitals:   06/20/21 1356  BP: 126/77  Pulse: (!) 106  Weight: 281 lb (127.5 kg)    Fetal Status: Fetal Heart Rate (bpm): 142 Fundal Height: 32 cm Movement: Present     General:  Alert, oriented and cooperative. Patient is in no acute distress.  Skin: Skin is warm and dry. No rash noted.   Cardiovascular: Normal heart rate noted  Respiratory: Normal respiratory effort, no problems with respiration noted  Abdomen: Soft, gravid, appropriate for gestational age.  Pain/Pressure: Present     Pelvic: Cervical exam deferred        Extremities: Normal range of motion.  Edema: Mild pitting, slight indentation  Mental Status: Normal mood and affect. Normal behavior. Normal judgment and thought content.   Assessment and Plan:  Pregnancy: G3P1011 at [redacted]w[redacted]d 1. Supervision of high risk pregnancy, antepartum  2. [redacted] weeks gestation of pregnancy  3. Poor fetal growth affecting management of mother in third trimester, single or unspecified fetus Resolved. Repeat scans PRN 5/31: 1357gm, 26%, ac 13%, afi 14 5/2: 15% and 10% prev  4. Maternal age 62+, multigravida, antepartum  5. Obesity affecting  pregnancy, antepartum  6. BMI 37.0-37.9, adult  Preterm labor symptoms and general obstetric precautions including but not limited to vaginal bleeding, contractions, leaking of fluid and fetal movement were reviewed in detail with the patient. Please refer to After Visit Summary for other counseling recommendations.   No follow-ups on file.  Future Appointments  Date Time Provider [place]  07/04/2021  1:30 PM Aletha Halim, MD [facility]  07/11/2021  1:30 PM Darlina Rumpf, CNM [facility]  07/25/2021  1:50 PM Darlina Rumpf, CNM [facility]  08/01/2021  1:30 PM Aletha Halim, MD [facility]  08/08/2021  1:50 PM Darlina Rumpf, CNM [facility]  08/15/2021  1:30 PM Aletha Halim, MD [facility]  08/22/2021  1:30 PM Darlina Rumpf, CNM [facility]    Aletha Halim, MD

## 2021-06-28 DIAGNOSIS — F411 Generalized anxiety disorder: Secondary | ICD-10-CM | POA: Diagnosis not present

## 2021-06-28 DIAGNOSIS — Z638 Other specified problems related to primary support group: Secondary | ICD-10-CM | POA: Diagnosis not present

## 2021-07-02 DIAGNOSIS — F411 Generalized anxiety disorder: Secondary | ICD-10-CM | POA: Diagnosis not present

## 2021-07-02 DIAGNOSIS — Z638 Other specified problems related to primary support group: Secondary | ICD-10-CM | POA: Diagnosis not present

## 2021-07-04 ENCOUNTER — Encounter: Payer: Self-pay | Admitting: Obstetrics and Gynecology

## 2021-07-04 ENCOUNTER — Ambulatory Visit (INDEPENDENT_AMBULATORY_CARE_PROVIDER_SITE_OTHER): Payer: BC Managed Care – PPO | Admitting: Obstetrics and Gynecology

## 2021-07-04 VITALS — BP 129/83 | HR 111 | Wt 286.0 lb

## 2021-07-04 DIAGNOSIS — O09529 Supervision of elderly multigravida, unspecified trimester: Secondary | ICD-10-CM

## 2021-07-04 DIAGNOSIS — O0990 Supervision of high risk pregnancy, unspecified, unspecified trimester: Secondary | ICD-10-CM

## 2021-07-04 DIAGNOSIS — Z3A32 32 weeks gestation of pregnancy: Secondary | ICD-10-CM

## 2021-07-04 NOTE — Progress Notes (Signed)
   PRENATAL VISIT NOTE  Subjective:  Sherry [name] is a 36 y.o. G3P1011 at [redacted]w[redacted]d being seen today for ongoing prenatal care.  She is currently monitored for the following issues for this low-risk pregnancy and has Supervision of high risk pregnancy, antepartum; Obesity affecting pregnancy, antepartum; BMI 37.0-37.9, adult; Maternal age 91+, multigravida, antepartum; Poor fetal growth affecting management of mother; and Herpes on their problem list.  Patient reports  pelvic discomfort on the side and transient blurry left eye  .  Contractions: Not present. Vag. Bleeding: None.  Movement: Present. Denies leaking of fluid.   [name], current medications, past family history, past medical history, past social history, past surgical history and problem list.   Objective:   Vitals:   07/04/21 1341 07/04/21 1358  BP: 139/80 129/83  Pulse: (!) 111 (!) 111  Weight: 286 lb (129.7 kg)     Fetal Status: Fetal Heart Rate (bpm): 150 Fundal Height: 33 cm Movement: Present     General:  Alert, oriented and cooperative. Patient is in no acute distress.  Skin: Skin is warm and dry. No rash noted.   Cardiovascular: Normal heart rate noted  Respiratory: Normal respiratory effort, no problems with respiration noted  Abdomen: Soft, gravid, appropriate for gestational age.  Pain/Pressure: Present     Pelvic: Cervical exam deferred        Extremities: Normal range of motion.  Edema: None  Mental Status: Normal mood and affect. Normal behavior. Normal judgment and thought content.   Assessment and Plan:  Pregnancy: G3P1011 at [redacted]w[redacted]d 1. Supervision of high risk pregnancy, antepartum S/s consistent with pubic symphysis discomfort and behavioral interventions advised. 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 If gets worse or persists  postpartum, pt to let us know.  IUD. Desires 39wk IOL  2. Maternal age 30+, multigravida, antepartum No issues   Preterm labor symptoms and general obstetric precautions including but not limited to vaginal bleeding, contractions, leaking of fluid and fetal movement were reviewed in detail with the patient. Please refer to After Visit Summary for other counseling recommendations.   Return in about 2 weeks (around 07/18/2021) for md or app, low risk ob.  Future Appointments  Date Time Provider Department Center  07/11/2021  1:30 PM Calvert Cantor, [place] [facility]  07/25/2021  1:50 PM Calvert Cantor, CNM [facility]  08/01/2021  1:30 PM [place] Bing, MD [facility]  08/08/2021  1:50 PM Calvert Cantor, CNM [facility]  08/15/2021  1:30 PM [place] Bing, MD [facility]  08/22/2021  1:30 PM Calvert Cantor, CNM [facility]    [place] Bing, MD

## 2021-07-04 NOTE — Progress Notes (Signed)
ROB [redacted]w[redacted]d  CC: Pt on Rt pelvic side  Pt notices blurriness in Lt eye sometimes. No HA's , no swelling this week.

## 2021-07-11 ENCOUNTER — Encounter: Payer: BC Managed Care – PPO | Admitting: Advanced Practice Midwife

## 2021-07-18 ENCOUNTER — Encounter: Payer: Self-pay | Admitting: Radiology

## 2021-07-25 ENCOUNTER — Ambulatory Visit (INDEPENDENT_AMBULATORY_CARE_PROVIDER_SITE_OTHER): Payer: BC Managed Care – PPO | Admitting: Advanced Practice Midwife

## 2021-07-25 VITALS — BP 125/82 | HR 102 | Wt 289.6 lb

## 2021-07-25 DIAGNOSIS — Z3A35 35 weeks gestation of pregnancy: Secondary | ICD-10-CM

## 2021-07-25 DIAGNOSIS — O09529 Supervision of elderly multigravida, unspecified trimester: Secondary | ICD-10-CM

## 2021-07-25 DIAGNOSIS — O99213 Obesity complicating pregnancy, third trimester: Secondary | ICD-10-CM

## 2021-07-25 DIAGNOSIS — Z6837 Body mass index (BMI) 37.0-37.9, adult: Secondary | ICD-10-CM

## 2021-07-25 DIAGNOSIS — B009 Herpesviral infection, unspecified: Secondary | ICD-10-CM

## 2021-07-25 DIAGNOSIS — O99210 Obesity complicating pregnancy, unspecified trimester: Secondary | ICD-10-CM

## 2021-07-25 DIAGNOSIS — O0993 Supervision of high risk pregnancy, unspecified, third trimester: Secondary | ICD-10-CM

## 2021-07-25 DIAGNOSIS — O0990 Supervision of high risk pregnancy, unspecified, unspecified trimester: Secondary | ICD-10-CM

## 2021-07-25 DIAGNOSIS — O09523 Supervision of elderly multigravida, third trimester: Secondary | ICD-10-CM

## 2021-07-25 NOTE — Progress Notes (Signed)
Pt presents for ROB at [redacted]w[redacted]d. Reports + fetal movement with occasional braxton hicks contractions.  Denies vaginal bleeding, LOF.  No longer desires IOL at 39 weeks.

## 2021-07-26 ENCOUNTER — Encounter: Payer: Self-pay | Admitting: Advanced Practice Midwife

## 2021-07-26 NOTE — Progress Notes (Signed)
   PRENATAL VISIT NOTE  Subjective:  Sherry [name] is a 36 y.o. G3P1011 at [redacted]w[redacted]d being seen today for ongoing prenatal care.  She is currently monitored for the following issues for this high-risk pregnancy and has Supervision of high risk pregnancy, antepartum; Obesity affecting pregnancy, antepartum; BMI 37.0-37.9, adult; Maternal age 17+, multigravida, antepartum; Poor fetal growth affecting management of mother; and Herpes on their problem list.  Patient reports no complaints.  Contractions: Irritability. Vag. Bleeding: None.  Movement: Present. Denies leaking of fluid.   [name], current medications, past family history, past medical history, past social history, past surgical history and problem list. Problem list updated.  Objective:   Vitals:   07/25/21 1409  BP: 125/82  Pulse: (!) 102  Weight: 289 lb 9.6 oz (131.4 kg)    Fetal Status: Fetal Heart Rate (bpm): 128   Movement: Present     General:  Alert, oriented and cooperative. Patient is in no acute distress.  Skin: Skin is warm and dry. No rash noted.   Cardiovascular: Normal heart rate noted  Respiratory: Normal respiratory effort, no problems with respiration noted  Abdomen: Soft, gravid, appropriate for gestational age.  Pain/Pressure: Present     Pelvic: Cervical exam deferred        Extremities: Normal range of motion.     Mental Status: Normal mood and affect. Normal behavior. Normal judgment and thought content.   Assessment and Plan:  Pregnancy: G3P1011 at [redacted]w[redacted]d  1. Supervision of high risk pregnancy, antepartum - Occasional Braxton Hicks  - No longer desires IOL at 39 weeks, declined scheduling during today's visit - Given she is 35+5 today, discussed deferring GBS to next week. Pt agreeable  2. Obesity affecting pregnancy, antepartum - TWG 69 lbs  3. BMI 37.0-37.9, adult   4. Maternal age 45+, multigravida,  antepartum   5. Herpes - Accompanied by partner today, unable to confirm on suppression per patient request for privacy - Will follow up via MyChart message  Preterm labor symptoms and general obstetric precautions including but not limited to vaginal bleeding, contractions, leaking of fluid and fetal movement were reviewed in detail with the patient. Please refer to After Visit Summary for other counseling recommendations.    Future Appointments  Date Time Provider Department Center  08/01/2021  1:30 PM [place] Bing, MD [facility]  08/08/2021  1:50 PM Calvert Cantor, CNM [facility]  08/15/2021  1:30 PM [place] Bing, MD [facility]  08/22/2021  1:30 PM Calvert Cantor, CNM [facility]    Calvert Cantor, [place]

## 2021-08-01 ENCOUNTER — Other Ambulatory Visit (HOSPITAL_COMMUNITY)
Admission: RE | Admit: 2021-08-01 | Discharge: 2021-08-01 | Disposition: A | Discharge status: Home / Self Care | Payer: BC Managed Care – PPO | Source: Ambulatory Visit | Attending: Obstetrics and Gynecology | Admitting: Obstetrics and Gynecology

## 2021-08-01 ENCOUNTER — Ambulatory Visit (INDEPENDENT_AMBULATORY_CARE_PROVIDER_SITE_OTHER): Payer: BC Managed Care – PPO | Admitting: Obstetrics and Gynecology

## 2021-08-01 VITALS — BP 136/81 | HR 94 | Wt 293.0 lb

## 2021-08-01 DIAGNOSIS — Z3A36 36 weeks gestation of pregnancy: Secondary | ICD-10-CM

## 2021-08-01 DIAGNOSIS — O09529 Supervision of elderly multigravida, unspecified trimester: Secondary | ICD-10-CM

## 2021-08-01 DIAGNOSIS — O99210 Obesity complicating pregnancy, unspecified trimester: Secondary | ICD-10-CM

## 2021-08-01 DIAGNOSIS — O09523 Supervision of elderly multigravida, third trimester: Secondary | ICD-10-CM | POA: Diagnosis not present

## 2021-08-01 DIAGNOSIS — O0990 Supervision of high risk pregnancy, unspecified, unspecified trimester: Secondary | ICD-10-CM | POA: Insufficient documentation

## 2021-08-01 DIAGNOSIS — O99213 Obesity complicating pregnancy, third trimester: Secondary | ICD-10-CM

## 2021-08-01 DIAGNOSIS — Z6837 Body mass index (BMI) 37.0-37.9, adult: Secondary | ICD-10-CM

## 2021-08-01 DIAGNOSIS — B009 Herpesviral infection, unspecified: Secondary | ICD-10-CM

## 2021-08-01 NOTE — Progress Notes (Signed)
   PRENATAL VISIT NOTE  Subjective:  Sherry [name] is a 36 y.o. G3P1011 at [redacted]w[redacted]d being seen today for ongoing prenatal care.  She is currently monitored for the following issues for this high-risk pregnancy and has Supervision of high risk pregnancy, antepartum; Obesity affecting pregnancy, antepartum; BMI 37.0-37.9, adult; Maternal age 90+, multigravida, antepartum; and h/o HSV on their problem list.  Patient reports no complaints.  Contractions: Not present. Vag. Bleeding: None.  Movement: Present. Denies leaking of fluid.   [name], current medications, past family history, past medical history, past social history, past surgical history and problem list.   Objective:   Vitals:   08/01/21 1343  BP: 136/81  Pulse: 94  Weight: 293 lb (132.9 kg)    Fetal Status: Fetal Heart Rate (bpm): 140 Fundal Height: 36 cm Movement: Present  Presentation: Vertex  General:  Alert, oriented and cooperative. Patient is in no acute distress.  Skin: Skin is warm and dry. No rash noted.   Cardiovascular: Normal heart rate noted  Respiratory: Normal respiratory effort, no problems with respiration noted  Abdomen: Soft, gravid, appropriate for gestational age.  Pain/Pressure: Absent     Pelvic: Cervical exam performed in the presence of a chaperone Dilation: 1 Effacement (%): Thick Station: Ballotable  Extremities: Normal range of motion.  Edema: Trace  Mental Status: Normal mood and affect. Normal behavior. Normal judgment and thought content.   Assessment and Plan:  Pregnancy: G3P1011 at [redacted]w[redacted]d 1. [redacted] weeks gestation of pregnancy IUD - Cervicovaginal ancillary only - Strep Gp B NAA  2. BMI 37.0-37.9, adult  3. Obesity affecting pregnancy, antepartum  4. Maternal age 77+, multigravida, antepartum Pt amenable to 39-40wk IOL. Would like it as close to edc as possible  5. h/o HSV Confirms on ppx  Preterm  labor symptoms and general obstetric precautions including but not limited to vaginal bleeding, contractions, leaking of fluid and fetal movement were reviewed in detail with the patient. Please refer to After Visit Summary for other counseling recommendations.   No follow-ups on file.  Future Appointments  Date Time Provider Department Center  08/08/2021  1:50 PM Calvert Cantor, [place] [facility]  08/15/2021  1:30 PM [place] Bing, MD [facility]  08/22/2021  1:30 PM Calvert Cantor, CNM [facility]    [place] Bing, MD

## 2021-08-02 LAB — CERVICOVAGINAL ANCILLARY ONLY
Chlamydia: NEGATIVE
Comment: NEGATIVE
Comment: NORMAL
Neisseria Gonorrhea: NEGATIVE

## 2021-08-03 LAB — STREP GP B NAA: Strep Gp B NAA: NEGATIVE

## 2021-08-08 ENCOUNTER — Ambulatory Visit (INDEPENDENT_AMBULATORY_CARE_PROVIDER_SITE_OTHER): Payer: BC Managed Care – PPO | Admitting: Advanced Practice Midwife

## 2021-08-08 VITALS — BP 121/70 | HR 114 | Wt 294.0 lb

## 2021-08-08 DIAGNOSIS — O99210 Obesity complicating pregnancy, unspecified trimester: Secondary | ICD-10-CM

## 2021-08-08 DIAGNOSIS — O0990 Supervision of high risk pregnancy, unspecified, unspecified trimester: Secondary | ICD-10-CM

## 2021-08-08 DIAGNOSIS — B009 Herpesviral infection, unspecified: Secondary | ICD-10-CM

## 2021-08-08 DIAGNOSIS — Z3A37 37 weeks gestation of pregnancy: Secondary | ICD-10-CM

## 2021-08-08 NOTE — Patient Instructions (Addendum)
Congratulations, you're on your way to having your baby!!!   You now have an induction scheduled.   If your induction is scheduled for midnight, you will arrive at 11:45pm on the date provided on the form form the clinical staff today. [facility] (Entrance C) and tell them you are there for your induction. The hospital will only call you to not come if they have NO beds available and need to postpone your admission time.    If your induction is scheduled for the daytime, you will see an appointment time in MyChart. Please DO NOT show up at this time, this is just a placeholder on the schedule. You will get a call when your room is ready and will have 2 hours to arrive. The hospital staff can call anytime starting at 5 am through the rest of the day.   You will also get a call from the pre-admission nurse to go over pre-admission screen about 2 days prior to your induction date.      Cervical Ripening (to get your cervix ready for labor) : May try one or all:  Red Raspberry Leaf capsules:  two 300mg or 400mg tablets with each meal, 2-3 times a day  Potential Side Effects Of Raspberry Leaf:  Most women do not experience any side effects from drinking raspberry leaf tea. However, nausea and loose stools are possible     Evening Primrose Oil capsules: may take 1 to 3 capsules daily. May also prick one to release the oil and insert it into your vagina at night.  Some of the potential side effects:  Upset stomach  Loose stools or diarrhea  Headaches  Nausea   6 Dates a day (may taste better if warmed in microwave until soft). Found where raisins are in the grocery store  

## 2021-08-08 NOTE — Progress Notes (Signed)
   PRENATAL VISIT NOTE  Subjective:  Sherry [name] is a 36 y.o. G3P1011 at [redacted]w[redacted]d being seen today for ongoing prenatal care.  She is currently monitored for the following issues for this high-risk pregnancy and has Supervision of high risk pregnancy, antepartum; Obesity affecting pregnancy, antepartum; BMI 37.0-37.9, adult; Maternal age 16+, multigravida, antepartum; and h/o HSV on their problem list.  Patient reports occasional contractions.  Contractions: Irregular. Vag. Bleeding: None.  Movement: Present. Denies leaking of fluid.   [name], current medications, past family history, past medical history, past social history, past surgical history and problem list. Problem list updated.  Objective:   Vitals:   08/08/21 1355  BP: 121/70  Pulse: (!) 114  Weight: 294 lb (133.4 kg)    Fetal Status: Fetal Heart Rate (bpm): 141   Movement: Present  Presentation: Vertex  General:  Alert, oriented and cooperative. Patient is in no acute distress.  Skin: Skin is warm and dry. No rash noted.   Cardiovascular: Normal heart rate noted  Respiratory: Normal respiratory effort, no problems with respiration noted  Abdomen: Soft, gravid, appropriate for gestational age.  Pain/Pressure: Absent     Pelvic: Cervical exam performed per patient request  Dilation: 1 Effacement (%): Thick Station: Ballotable  Extremities: Normal range of motion.  Edema: Trace  Mental Status: Normal mood and affect. Normal behavior. Normal judgment and thought content.   Assessment and Plan:  Pregnancy: G3P1011 at [redacted]w[redacted]d  1. Supervision of high risk pregnancy, antepartum - IOL scheduled for daytime 08/11 at 39+6 - Plan for Cytotec + foley bulb based on today's exam - CBC; Standing - Type and screen; Standing - RPR; Standing  2. Obesity affecting pregnancy, antepartum - TWG 74 lb, GTT WNL  3. h/o HSV - On suppression  4.  [redacted] weeks gestation of pregnancy   Term labor symptoms and general obstetric precautions including but not limited to vaginal bleeding, contractions, leaking of fluid and fetal movement were reviewed in detail with the patient. Please refer to After Visit Summary for other counseling recommendations.  Return in about 1 week (around 08/15/2021) for MD or APP.  Future Appointments  Date Time Provider Department Center  08/15/2021  1:30 PM Coleman Bing, MD [facility]  08/22/2021  1:30 PM Calvert Cantor, CNM [facility]  08/23/2021  7:30 AM [facility] SCHED ROOM [facility] None    Calvert Cantor, CNM

## 2021-08-13 ENCOUNTER — Telehealth (HOSPITAL_COMMUNITY): Payer: Self-pay | Admitting: *Deleted

## 2021-08-13 ENCOUNTER — Encounter (HOSPITAL_COMMUNITY): Payer: Self-pay

## 2021-08-13 NOTE — Telephone Encounter (Signed)
Preadmission screen  

## 2021-08-14 ENCOUNTER — Other Ambulatory Visit: Payer: Self-pay | Admitting: Advanced Practice Midwife

## 2021-08-14 ENCOUNTER — Encounter: Payer: Self-pay | Admitting: Obstetrics and Gynecology

## 2021-08-15 ENCOUNTER — Ambulatory Visit (INDEPENDENT_AMBULATORY_CARE_PROVIDER_SITE_OTHER): Payer: BC Managed Care – PPO | Admitting: Obstetrics and Gynecology

## 2021-08-15 ENCOUNTER — Encounter: Payer: Self-pay | Admitting: Obstetrics and Gynecology

## 2021-08-15 VITALS — BP 123/83 | HR 114 | Wt 297.0 lb

## 2021-08-15 DIAGNOSIS — O99210 Obesity complicating pregnancy, unspecified trimester: Secondary | ICD-10-CM

## 2021-08-15 DIAGNOSIS — Z6837 Body mass index (BMI) 37.0-37.9, adult: Secondary | ICD-10-CM

## 2021-08-15 DIAGNOSIS — O0990 Supervision of high risk pregnancy, unspecified, unspecified trimester: Secondary | ICD-10-CM

## 2021-08-15 DIAGNOSIS — O09529 Supervision of elderly multigravida, unspecified trimester: Secondary | ICD-10-CM

## 2021-08-15 NOTE — Progress Notes (Signed)
ROB [redacted]w[redacted]d  CC: Pain in Lower Back.   Pt c/o possible skin tag unsure if it was a tick. Denies any fever notes pain at the area. Pain in Left ear x 2 days.

## 2021-08-15 NOTE — Progress Notes (Signed)
   PRENATAL VISIT NOTE  Subjective:  Sherry [name] is a 36 y.o. G3P1011 at [redacted]w[redacted]d being seen today for ongoing prenatal care.  She is currently monitored for the following issues for this low-risk pregnancy and has Supervision of high risk pregnancy, antepartum; Obesity affecting pregnancy, antepartum; BMI 37.0-37.9, adult; Maternal age 54+, multigravida, antepartum; and h/o HSV on their problem list.  Patient reports  left ear discomfort and area on chest that is painful ?skin tag vs tick .  Contractions: Irritability. Vag. Bleeding: None.  Movement: Present. Denies leaking of fluid.   [name], current medications, past family history, past medical history, past social history, past surgical history and problem list.   Objective:   Vitals:   08/15/21 1348  BP: 123/83  Pulse: (!) 114  Weight: 297 lb (134.7 kg)    Fetal Status: Fetal Heart Rate (bpm): 152 Fundal Height: 37 cm Movement: Present  Presentation: Vertex  General:  Alert, oriented and cooperative. Patient is in no acute distress.  Skin: Skin is warm and dry. No rash noted.   Cardiovascular: Normal heart rate noted  Respiratory: Normal respiratory effort, no problems with respiration noted  Abdomen: Soft, gravid, appropriate for gestational age.  Pain/Pressure: Present     Pelvic: Cervical exam performed in the presence of a chaperone Dilation: 1.5 Effacement (%): 50 Station: -3  Extremities: Normal range of motion.  Edema: Moderate pitting, indentation subsides rapidly  Mental Status: Normal mood and affect. Normal behavior. Normal judgment and thought content.  HEENT: +cerumen b/l, normal TMs, no mastoid tenderness or cervical LAD Chest: over right upper left chest area, there is a subcm brown skin tag, normal overlying skin,   Assessment and Plan:  Pregnancy: G3P1011 at [redacted]w[redacted]d 1. Obesity affecting pregnancy, antepartum  2. BMI  37.0-37.9, adult  3. Supervision of high risk pregnancy, antepartum GBS neg. Continue valtrex ppx. Normal appearing skin tag. Continue exp management  4. Maternal age 40+, multigravida, antepartum For IOL on 8/11  Term labor symptoms and general obstetric precautions including but not limited to vaginal bleeding, contractions, leaking of fluid and fetal movement were reviewed in detail with the patient. Please refer to After Visit Summary for other counseling recommendations.   No follow-ups on file.  Future Appointments  Date Time Provider Department Center  08/22/2021  1:30 PM Briant Sites [facility]  08/23/2021  7:30 AM [facility] SCHED ROOM [facility] None    Birdsong Bing, MD

## 2021-08-16 ENCOUNTER — Encounter (HOSPITAL_COMMUNITY): Payer: Self-pay | Admitting: *Deleted

## 2021-08-16 ENCOUNTER — Telehealth (HOSPITAL_COMMUNITY): Payer: Self-pay | Admitting: *Deleted

## 2021-08-16 NOTE — Telephone Encounter (Signed)
Preadmission screen  

## 2021-08-22 ENCOUNTER — Ambulatory Visit (INDEPENDENT_AMBULATORY_CARE_PROVIDER_SITE_OTHER): Payer: BC Managed Care – PPO | Admitting: Advanced Practice Midwife

## 2021-08-22 VITALS — BP 129/83 | HR 130 | Wt 299.0 lb

## 2021-08-22 DIAGNOSIS — O09523 Supervision of elderly multigravida, third trimester: Secondary | ICD-10-CM

## 2021-08-22 DIAGNOSIS — O09529 Supervision of elderly multigravida, unspecified trimester: Secondary | ICD-10-CM

## 2021-08-22 DIAGNOSIS — O0990 Supervision of high risk pregnancy, unspecified, unspecified trimester: Secondary | ICD-10-CM

## 2021-08-22 DIAGNOSIS — O99213 Obesity complicating pregnancy, third trimester: Secondary | ICD-10-CM

## 2021-08-22 DIAGNOSIS — O99210 Obesity complicating pregnancy, unspecified trimester: Secondary | ICD-10-CM

## 2021-08-22 DIAGNOSIS — Z3A39 39 weeks gestation of pregnancy: Secondary | ICD-10-CM

## 2021-08-22 DIAGNOSIS — B009 Herpesviral infection, unspecified: Secondary | ICD-10-CM

## 2021-08-22 DIAGNOSIS — O0993 Supervision of high risk pregnancy, unspecified, third trimester: Secondary | ICD-10-CM

## 2021-08-22 NOTE — Progress Notes (Signed)
   PRENATAL VISIT NOTE  Subjective:  Sherry [name] is a 36 y.o. G3P1011 at [redacted]w[redacted]d being seen today for ongoing prenatal care.  She is currently monitored for the following issues for this high-risk pregnancy and has Supervision of high risk pregnancy, antepartum; Obesity affecting pregnancy, antepartum; BMI 37.0-37.9, adult; Maternal age 70+, multigravida, antepartum; and h/o HSV on their problem list.  Patient reports occasional contractions.  Contractions: Irregular. Vag. Bleeding: None.  Movement: Present. Denies leaking of fluid.   [name], current medications, past family history, past medical history, past social history, past surgical history and problem list. Problem list updated.  Objective:   Vitals:   08/22/21 1348  BP: 129/83  Pulse: (!) 130  Weight: 299 lb (135.6 kg)    Fetal Status: Fetal Heart Rate (bpm): 150   Movement: Present     General:  Alert, oriented and cooperative. Patient is in no acute distress.  Skin: Skin is warm and dry. No rash noted.   Cardiovascular: Normal heart rate noted  Respiratory: Normal respiratory effort, no problems with respiration noted  Abdomen: Soft, gravid, appropriate for gestational age.  Pain/Pressure: Present     Pelvic: Cervical exam performed Dilation: 1.5 Effacement (%): 50 Station: -3  Extremities: Normal range of motion.  Edema: Moderate pitting, indentation subsides rapidly  Mental Status: Normal mood and affect. Normal behavior. Normal judgment and thought content.   Assessment and Plan:  Pregnancy: G3P1011 at [redacted]w[redacted]d  1. Supervision of high risk pregnancy, antepartum - Routine care with labor precautions - IOL daytime tomorrow - Discussed membrane sweeping today. Reviewed cochrane review data on membrane sweeping at 39 wks. Reviewed risk of cramping, contractions, bleeding and ROM. Answered patient questions and she agreed to proceed  with procedure.   2. Maternal age 61+, multigravida, antepartum   3. Obesity affecting pregnancy, antepartum   4. h/o HSV - On suppression, no lesions noted today  5. [redacted] weeks gestation of pregnancy   Term labor symptoms and general obstetric precautions including but not limited to vaginal bleeding, contractions, leaking of fluid and fetal movement were reviewed in detail with the patient. Please refer to After Visit Summary for other counseling recommendations.   Future Appointments  Date Time Provider Department Center  08/23/2021  7:30 AM [facility] SCHED ROOM [facility] None    Calvert Cantor, CNM

## 2021-08-23 ENCOUNTER — Other Ambulatory Visit: Payer: Self-pay

## 2021-08-23 ENCOUNTER — Inpatient Hospital Stay (HOSPITAL_COMMUNITY)
Admission: AD | Admit: 2021-08-23 | Discharge: 2021-08-26 | DRG: 807 | Disposition: A | Discharge status: Home / Self Care | Payer: BC Managed Care – PPO | Attending: Obstetrics & Gynecology | Admitting: Obstetrics & Gynecology

## 2021-08-23 ENCOUNTER — Inpatient Hospital Stay (HOSPITAL_COMMUNITY): Payer: BC Managed Care – PPO

## 2021-08-23 ENCOUNTER — Inpatient Hospital Stay (HOSPITAL_COMMUNITY): Payer: BC Managed Care – PPO | Admitting: Anesthesiology

## 2021-08-23 ENCOUNTER — Encounter (HOSPITAL_COMMUNITY): Payer: Self-pay | Admitting: Family Medicine

## 2021-08-23 DIAGNOSIS — O9832 Other infections with a predominantly sexual mode of transmission complicating childbirth: Secondary | ICD-10-CM | POA: Diagnosis not present

## 2021-08-23 DIAGNOSIS — Z87410 Personal history of cervical dysplasia: Secondary | ICD-10-CM | POA: Diagnosis not present

## 2021-08-23 DIAGNOSIS — O99214 Obesity complicating childbirth: Secondary | ICD-10-CM | POA: Diagnosis present

## 2021-08-23 DIAGNOSIS — Z7982 Long term (current) use of aspirin: Secondary | ICD-10-CM

## 2021-08-23 DIAGNOSIS — Z30430 Encounter for insertion of intrauterine contraceptive device: Secondary | ICD-10-CM | POA: Diagnosis not present

## 2021-08-23 DIAGNOSIS — O0990 Supervision of high risk pregnancy, unspecified, unspecified trimester: Secondary | ICD-10-CM

## 2021-08-23 DIAGNOSIS — Z3A39 39 weeks gestation of pregnancy: Secondary | ICD-10-CM

## 2021-08-23 DIAGNOSIS — O09523 Supervision of elderly multigravida, third trimester: Secondary | ICD-10-CM | POA: Diagnosis not present

## 2021-08-23 DIAGNOSIS — O09529 Supervision of elderly multigravida, unspecified trimester: Secondary | ICD-10-CM

## 2021-08-23 DIAGNOSIS — O99210 Obesity complicating pregnancy, unspecified trimester: Principal | ICD-10-CM

## 2021-08-23 DIAGNOSIS — A6000 Herpesviral infection of urogenital system, unspecified: Secondary | ICD-10-CM | POA: Diagnosis not present

## 2021-08-23 DIAGNOSIS — Z6837 Body mass index (BMI) 37.0-37.9, adult: Secondary | ICD-10-CM

## 2021-08-23 DIAGNOSIS — O26893 Other specified pregnancy related conditions, third trimester: Principal | ICD-10-CM | POA: Diagnosis present

## 2021-08-23 DIAGNOSIS — Z3A40 40 weeks gestation of pregnancy: Secondary | ICD-10-CM | POA: Diagnosis not present

## 2021-08-23 LAB — RPR: RPR Ser Ql: NONREACTIVE

## 2021-08-23 LAB — CBC
HCT: 36.8 % (ref 36.0–46.0)
Hemoglobin: 12.1 g/dL (ref 12.0–15.0)
MCH: 27.6 pg (ref 26.0–34.0)
MCHC: 32.9 g/dL (ref 30.0–36.0)
MCV: 84 fL (ref 80.0–100.0)
Platelets: 364 10*3/uL (ref 150–400)
RBC: 4.38 MIL/uL (ref 3.87–5.11)
RDW: 14.8 % (ref 11.5–15.5)
WBC: 8.9 10*3/uL (ref 4.0–10.5)
nRBC: 0 % (ref 0.0–0.2)

## 2021-08-23 LAB — TYPE AND SCREEN
ABO/RH(D): AB POS
Antibody Screen: NEGATIVE

## 2021-08-23 MED ORDER — PHENYLEPHRINE 80 MCG/ML (10ML) SYRINGE FOR IV PUSH (FOR BLOOD PRESSURE SUPPORT)
80.0000 ug | PREFILLED_SYRINGE | INTRAVENOUS | Status: DC | PRN
  Filled 2021-08-23: qty 10

## 2021-08-23 MED ORDER — OXYTOCIN-SODIUM CHLORIDE 30-0.9 UT/500ML-% IV SOLN
1.0000 m[IU]/min | INTRAVENOUS | Status: DC
  Administered 2021-08-23: 2 m[IU]/min via INTRAVENOUS
  Filled 2021-08-23: qty 500

## 2021-08-23 MED ORDER — OXYTOCIN-SODIUM CHLORIDE 30-0.9 UT/500ML-% IV SOLN: 2.5000 [IU]/h | INTRAVENOUS | Status: DC

## 2021-08-23 MED ORDER — OXYTOCIN BOLUS FROM INFUSION
333.0000 mL | Freq: Once | INTRAVENOUS | Status: AC
  Administered 2021-08-24: 333 mL via INTRAVENOUS

## 2021-08-23 MED ORDER — EPHEDRINE 5 MG/ML INJ: 10.0000 mg | INTRAVENOUS | Status: DC | PRN

## 2021-08-23 MED ORDER — ACETAMINOPHEN 325 MG PO TABS: 650.0000 mg | ORAL_TABLET | ORAL | Status: DC | PRN

## 2021-08-23 MED ORDER — TERBUTALINE SULFATE 1 MG/ML IJ SOLN: 0.2500 mg | Freq: Once | INTRAMUSCULAR | Status: DC | PRN

## 2021-08-23 MED ORDER — LACTATED RINGERS IV SOLN
500.0000 mL | Freq: Once | INTRAVENOUS | Status: AC
  Administered 2021-08-23: 500 mL via INTRAVENOUS

## 2021-08-23 MED ORDER — FENTANYL CITRATE (PF) 100 MCG/2ML IJ SOLN
50.0000 ug | Freq: Once | INTRAMUSCULAR | Status: AC
  Administered 2021-08-23: 50 ug via INTRAVENOUS

## 2021-08-23 MED ORDER — FENTANYL CITRATE (PF) 100 MCG/2ML IJ SOLN
INTRAMUSCULAR | Status: AC
  Filled 2021-08-23: qty 2

## 2021-08-23 MED ORDER — SOD CITRATE-CITRIC ACID 500-334 MG/5ML PO SOLN: 30.0000 mL | ORAL | Status: DC | PRN

## 2021-08-23 MED ORDER — LIDOCAINE HCL (PF) 1 % IJ SOLN: 30.0000 mL | INTRAMUSCULAR | Status: DC | PRN

## 2021-08-23 MED ORDER — MISOPROSTOL 25 MCG QUARTER TABLET: 25.0000 ug | ORAL_TABLET | ORAL | Status: DC | PRN

## 2021-08-23 MED ORDER — LIDOCAINE HCL (PF) 1 % IJ SOLN
INTRAMUSCULAR | Status: DC | PRN
  Administered 2021-08-23: 4 mL via EPIDURAL
  Administered 2021-08-23: 6 mL via EPIDURAL

## 2021-08-23 MED ORDER — PHENYLEPHRINE 80 MCG/ML (10ML) SYRINGE FOR IV PUSH (FOR BLOOD PRESSURE SUPPORT): 80.0000 ug | PREFILLED_SYRINGE | INTRAVENOUS | Status: DC | PRN

## 2021-08-23 MED ORDER — LEVONORGESTREL 20 MCG/DAY IU IUD
1.0000 | INTRAUTERINE_SYSTEM | Freq: Once | INTRAUTERINE | Status: AC
  Administered 2021-08-24: 1 via INTRAUTERINE
  Filled 2021-08-23: qty 1

## 2021-08-23 MED ORDER — FENTANYL CITRATE (PF) 100 MCG/2ML IJ SOLN
100.0000 ug | INTRAMUSCULAR | Status: DC | PRN
  Administered 2021-08-23: 100 ug via INTRAVENOUS

## 2021-08-23 MED ORDER — LACTATED RINGERS IV SOLN: 500.0000 mL | INTRAVENOUS | Status: DC | PRN

## 2021-08-23 MED ORDER — FENTANYL-BUPIVACAINE-NACL 0.5-0.125-0.9 MG/250ML-% EP SOLN
12.0000 mL/h | EPIDURAL | Status: DC | PRN
  Administered 2021-08-23: 12 mL/h via EPIDURAL
  Filled 2021-08-23: qty 250

## 2021-08-23 MED ORDER — DIPHENHYDRAMINE HCL 50 MG/ML IJ SOLN: 12.5000 mg | INTRAMUSCULAR | Status: DC | PRN

## 2021-08-23 MED ORDER — LACTATED RINGERS IV SOLN: INTRAVENOUS | Status: DC

## 2021-08-23 MED ORDER — MISOPROSTOL 50MCG HALF TABLET
50.0000 ug | ORAL_TABLET | ORAL | Status: DC | PRN
  Administered 2021-08-23: 50 ug via BUCCAL
  Filled 2021-08-23: qty 1

## 2021-08-23 MED ORDER — ONDANSETRON HCL 4 MG/2ML IJ SOLN
4.0000 mg | Freq: Four times a day (QID) | INTRAMUSCULAR | Status: DC | PRN
  Administered 2021-08-23 (×2): 4 mg via INTRAVENOUS
  Filled 2021-08-23 (×2): qty 2

## 2021-08-23 NOTE — Anesthesia Procedure Notes (Signed)
Epidural Patient location during procedure: OB Start time: 08/23/2021 3:18 PM End time: 08/23/2021 3:30 PM  Staffing Anesthesiologist: Lucretia Kern, MD Performed: anesthesiologist   Preanesthetic Checklist Completed: patient identified, IV checked, risks and benefits discussed, monitors and equipment checked, pre-op evaluation and timeout performed  Epidural Patient position: sitting Prep: DuraPrep Patient monitoring: heart rate, continuous pulse ox and blood pressure Approach: midline Location: L3-L4 Injection technique: LOR air  Needle:  Needle type: Tuohy  Needle gauge: 17 G Needle length: 9 cm Needle insertion depth: 8 cm Catheter type: closed end flexible Catheter size: 19 Gauge Catheter at skin depth: 14 cm Test dose: negative  Assessment Events: blood not aspirated, injection not painful, no injection resistance, no paresthesia and negative IV test  Additional Notes Reason for block:procedure for pain

## 2021-08-23 NOTE — Anesthesia Preprocedure Evaluation (Signed)
Anesthesia Evaluation  ?Patient identified by MRN, date of birth, ID band ?Patient awake ? ? ? ?Reviewed: ?Allergy & Precautions, H&P , NPO status , Patient's Chart, lab work & pertinent test results ? ?History of Anesthesia Complications ?Negative for: history of anesthetic complications ? ?Airway ?Mallampati: II ? ?TM Distance: >3 FB ? ? ? ? Dental ?  ?Pulmonary ?neg pulmonary ROS,  ?  ?Pulmonary exam normal ? ? ? ? ? ? ? Cardiovascular ?negative cardio ROS ? ? ?Rhythm:regular Rate:Normal ? ? ?  ?Neuro/Psych ?negative neurological ROS ? negative psych ROS  ? GI/Hepatic ?negative GI ROS, Neg liver ROS,   ?Endo/Other  ?Morbid obesity ? Renal/GU ?  ? ?  ?Musculoskeletal ? ? Abdominal ?  ?Peds ? Hematology ?negative hematology ROS ?(+)   ?Anesthesia Other Findings ? ? Reproductive/Obstetrics ?(+) Pregnancy ? ?  ? ? ? ? ? ? ? ? ? ? ? ? ? ?  ?  ? ? ? ? ? ? ? ? ?Anesthesia Physical ?Anesthesia Plan ? ?ASA: 3 ? ?Anesthesia Plan: Epidural  ? ?Post-op Pain Management:   ? ?Induction:  ? ?PONV Risk Score and Plan:  ? ?Airway Management Planned:  ? ?Additional Equipment:  ? ?Intra-op Plan:  ? ?Post-operative Plan:  ? ?Informed Consent: I have reviewed the patients History and Physical, chart, labs and discussed the procedure including the risks, benefits and alternatives for the proposed anesthesia with the patient or authorized representative who has indicated his/her understanding and acceptance.  ? ? ? ? ? ?Plan Discussed with:  ? ?Anesthesia Plan Comments:   ? ? ? ? ? ? ?Anesthesia Quick Evaluation ? ?

## 2021-08-23 NOTE — Progress Notes (Signed)
   Sherry [name] is a 36 y.o. G3P1011 at [redacted]w[redacted]d  admitted for IOL for AMA  Subjective:  Feeling well; no complaints.  Objective: Vitals:   08/23/21 1605 08/23/21 1630 08/23/21 1701 08/23/21 1730  BP: 112/79 118/76 (!) 104/58 (!) 104/57  Pulse: (!) 121 (!) 108 98 99  Resp: 16 14 16 16   Temp:      TempSrc:      SpO2:      Weight:      Height:       No intake/output data recorded.  FHT:  FHR: 130  bpm, variability: moderate,  accelerations:  Present,  decelerations:  Absent UC:   q2-3 SVE:   Dilation: 5 Effacement (%): 80 Station: -3 Exam by:: S Nix RN Pitocin @ 8 mu/min  Labs: Lab Results  Component Value Date   WBC 8.9 08/23/2021   HGB 12.1 08/23/2021   HCT 36.8 08/23/2021   MCV 84.0 08/23/2021   PLT 364 08/23/2021    Assessment / Plan: AROM clear fluid with IUPC placed Continue to increase pitocin Labor:  moving into active labor,  Fetal Wellbeing:  Category I Pain Control:  Epidural Anticipated MOD:  NSVD  10/23/2021 Sherry [name] 08/23/2021, 6:08 PM

## 2021-08-23 NOTE — Progress Notes (Signed)
Sherry [name] is a 36 y.o. G3P1011 at [redacted]w[redacted]d   Subjective: Comfortable s/p epidural, ready to meet her baby. Looking forward to postpartum Cookout.  Objective: BP 131/74   Pulse (!) 101   Temp 98.2 F (36.8 C) (Oral)   Resp 18   Ht 5\' 9"  (1.753 m)   Wt (!) 136.4 kg   LMP 11/17/2020   SpO2 100%   BMI 44.42 kg/m  No intake/output data recorded. Total I/O In: -  Out: 950 [Urine:950]  FHT:  FHR: 130 bpm, variability: moderate,  accelerations:  Present,  decelerations:  Absent UC:   irregular, every 2-4 minutes SVE:   Dilation: 5 Effacement (%): 80 Station: -1 Exam by:: [email or web address], CNM  Labs: Lab Results  Component Value Date   WBC 8.9 08/23/2021   HGB 12.1 08/23/2021   HCT 36.8 08/23/2021   MCV 84.0 08/23/2021   PLT 364 08/23/2021    Assessment / Plan: --36 y.o. G3P1011 at [redacted]w[redacted]d IOL for AMA --Cat I tracing --GBS Neg --S/p AROM at 1805 --Improved fetal engagement from previous exam --Pitocin infusing at 14 milliunits, continue to titrate PRN --Anticipate vaginal birth  [redacted]w[redacted]d, Calvert Cantor 08/23/2021, 10:23 PM

## 2021-08-23 NOTE — Progress Notes (Signed)
   Kathlyn Leachman is a 36 y.o. G3P1011 at [redacted]w[redacted]d  admitted for induction of labor due to [facility].  Subjective: Patient coping well, tolerated FB placement  Objective: Vitals:   08/23/21 0922 08/23/21 0923 08/23/21 1039 08/23/21 1329  BP:  132/81 125/81 126/82  Pulse:  (!) 108 97 97  Resp: 18  16 16   Temp: 98.4 F (36.9 C)   97.9 F (36.6 C)  TempSrc: Oral   Oral  Weight:      Height:       No intake/output data recorded.  FHT:  FHR: 145 bpm, variability: moderate,  accelerations:  Present,  decelerations:  Absent UC:   uterine irritability SVE:   Dilation: 1.5 Effacement (%): 70 Station: -2 Exam by:: [email or web address], RN Pitocin @ 0 mu/min  Labs: Lab Results  Component Value Date   WBC 8.9 08/23/2021   HGB 12.1 08/23/2021   HCT 36.8 08/23/2021   MCV 84.0 08/23/2021   PLT 364 08/23/2021    Assessment / Plan: Early labor, FB placed and cytotec given buccally  Labor:  early labor Fetal Wellbeing:  Category I Pain Control:  Labor support without medications Anticipated MOD:  NSVD  10/23/2021 Deren Degrazia 08/23/2021, 1:48 PM

## 2021-08-23 NOTE — H&P (Addendum)
OBSTETRIC ADMISSION HISTORY AND PHYSICAL  Sherry [name] is a 36 y.o. female G3P1011 with IUP at [redacted]w[redacted]d by LMP presenting for IOL for AMA. She reports +FMs, No LOF, no VB, no blurry vision, headaches or peripheral edema, and RUQ pain.  She plans on breast feeding. She requests IUD for birth control. She received her prenatal care at  [facility]    Dating: By LMP --->  Estimated Date of Delivery: 08/24/21  Sono:    @[redacted]w[redacted]d , CWD, normal anatomy, vertex presentation,  1357g, 26% EFW   Prenatal History/Complications:  - AMA - BMI 44  Past Medical History: Past Medical History:  Diagnosis Date   Depression    PPD   Headache    Mild cervical dysplasia    Postpartum care following vaginal delivery 08/08/2010   Type 2 HSV infection of vulvovaginal region    Vaginal Pap smear, abnormal     Past Surgical History: Past Surgical History:  Procedure Laterality Date   DILATION AND CURETTAGE, DIAGNOSTIC / THERAPEUTIC      Obstetrical History: OB History     Gravida  3   Para  1   Term  1   Preterm  0   AB  1   Living  1      SAB  0   IAB  0   Ectopic  0   Multiple  0   Live Births  1           Social History Social History   Socioeconomic History   Marital status: Single    Spouse name: Not on file   Number of children: 1   Years of education: Not on file   Highest education level: Not on file  Occupational History   Not on file  Tobacco Use   Smoking status: Never    Passive exposure: Never   Smokeless tobacco: Never  Vaping Use   Vaping Use: Never used  Substance and Sexual Activity   Alcohol use: Not Currently    Comment: social    Drug use: Never   Sexual activity: Yes  Other Topics Concern   Not on file  Social History Narrative   Not on file   Social Determinants of Health   Financial Resource Strain: Low Risk  (01/28/2021)   Overall Financial Resource Strain (CARDIA)    Difficulty of Paying Living Expenses: Not hard at all  Food Insecurity:  No Food Insecurity (05/03/2020)   Hunger Vital Sign    Worried About Running Out of Food in the Last Year: Never true    Ran Out of Food in the Last Year: Never true  Transportation Needs: No Transportation Needs (05/03/2020)   PRAPARE - 05/05/2020 (Medical): No    Lack of Transportation (Non-Medical): No  Physical Activity: Sufficiently Active (01/28/2021)   Exercise Vital Sign    Days of Exercise per Week: 7 days    Minutes of Exercise per Session: 60 min  Stress: No Stress Concern Present (01/28/2021)   01/30/2021 of Occupational Health - Occupational Stress Questionnaire    Feeling of Stress : Not at all  Social Connections: Moderately Integrated (01/28/2021)   Social Connection and Isolation Panel [NHANES]    Frequency of Communication with Friends and Family: More than three times a week    Frequency of Social Gatherings with Friends and Family: More than three times a week    Attends Religious Services: More than 4 times per year  Active Member of Clubs or Organizations: No    Attends Banker Meetings: Never    Marital Status: Living with partner    Family History: Family History  Problem Relation Age of Onset   Diabetes Mother    Cancer Maternal Uncle    Colon cancer Maternal Uncle    Cancer Maternal Grandmother    Kidney cancer Maternal Grandmother     Allergies: No Known Allergies  Pt denies allergies to latex, iodine, or shellfish.  Medications Prior to Admission  Medication Sig Dispense Refill Last Dose   aspirin 81 MG chewable tablet Chew 1 tablet (81 mg total) by mouth daily. 30 tablet 8    Prenatal Vit-Fe Fumarate-FA (MULTIVITAMIN-PRENATAL) 27-0.8 MG TABS tablet Take 1 tablet by mouth daily at 12 noon.      valACYclovir (VALTREX) 500 MG tablet Take 1 tablet by mouth 2 times daily. 60 tablet 12      Review of Systems   All systems reviewed and negative except as stated in HPI  Height 5\' 9"  (1.753 m),  weight (!) 136.4 kg, last menstrual period 11/17/2020. General appearance: alert and cooperative Lungs: clear to auscultation bilaterally Heart: regular rate and rhythm Abdomen: soft, non-tender; bowel sounds normal Extremities: Homans sign is negative, no sign of DVT Presentation: cephalic Fetal monitoringBaseline: 145 bpm, Variability: Good {> 6 bpm), Accelerations: Reactive, and Decelerations: Absent Uterine activityNone     Prenatal labs: ABO, Rh: --/--/PENDING (08/11 04-29-2002) Antibody: PENDING (08/11 04-29-2002) Rubella: 1.66 (01/16 1021) RPR: Non Reactive (05/11 0849)  HBsAg: Negative (01/16 1021)  HIV: Non Reactive (05/11 0849)  GBS: Negative/-- (07/20 1449)  2 hr Glucola: normal  Genetic screening:  low risk Anatomy 07-24-2005: normal   Prenatal Transfer Tool  Maternal Diabetes: No Genetic Screening: Normal Maternal Ultrasounds/Referrals: Normal Fetal Ultrasounds or other Referrals:  None Maternal Substance Abuse:  No Significant Maternal Medications:  None Significant Maternal Lab Results: Group B Strep negative  Results for orders placed or performed during the hospital encounter of 08/23/21 (from the past 24 hour(s))  Type and screen   Collection Time: 08/23/21  8:37 AM  Result Value Ref Range   ABO/RH(D) PENDING    Antibody Screen PENDING    Sample Expiration      08/26/2021,2359 Performed at [facility], [address], [place], [place]     Patient Active Problem List   Diagnosis Date Noted   Labor and delivery indication for care or intervention 08/23/2021   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   Obesity affecting pregnancy, antepartum 02/08/2021   BMI 37.0-37.9, adult 02/08/2021   Maternal age 77+, multigravida, antepartum 02/08/2021   Supervision of high risk pregnancy, antepartum 01/28/2021    Assessment/Plan:  Sherry [name] is a 36 y.o. G3P1011 at [redacted]w[redacted]d here for IOL for AMA  #Labor: IOL, cytotec buccal, planing FB (with premedication with IV pain meds)   #Pain: Planning epidural  #FWB: Cat 1 #ID:  GBS neg #MOF: breast #MOC: IUD placed PP  [redacted]w[redacted]d, DO  Center for [facility], [facility] 08/23/2021, 9:11 AM   Attestation of Supervision of Student:  I confirm that I have verified the information documented in the  resident  student's note and that I have also personally reperformed the history, physical exam and all medical decision making activities.  I have verified that all services and findings are accurately documented in this student's note; and I agree with management and plan as outlined in the documentation. I have also  made any necessary editorial changes.    Marylene Land, CNM Center for [employer], [facility] 08/23/2021 12:15 PM

## 2021-08-24 ENCOUNTER — Encounter (HOSPITAL_COMMUNITY): Payer: Self-pay | Admitting: Family Medicine

## 2021-08-24 DIAGNOSIS — Z30430 Encounter for insertion of intrauterine contraceptive device: Secondary | ICD-10-CM

## 2021-08-24 DIAGNOSIS — O09523 Supervision of elderly multigravida, third trimester: Secondary | ICD-10-CM

## 2021-08-24 DIAGNOSIS — Z3A39 39 weeks gestation of pregnancy: Secondary | ICD-10-CM

## 2021-08-24 LAB — CBC
HCT: 35.3 % — ABNORMAL LOW (ref 36.0–46.0)
Hemoglobin: 11.8 g/dL — ABNORMAL LOW (ref 12.0–15.0)
MCH: 27.6 pg (ref 26.0–34.0)
MCHC: 33.4 g/dL (ref 30.0–36.0)
MCV: 82.7 fL (ref 80.0–100.0)
Platelets: 357 10*3/uL (ref 150–400)
RBC: 4.27 MIL/uL (ref 3.87–5.11)
RDW: 15.1 % (ref 11.5–15.5)
WBC: 17.4 10*3/uL — ABNORMAL HIGH (ref 4.0–10.5)
nRBC: 0 % (ref 0.0–0.2)

## 2021-08-24 MED ORDER — ONDANSETRON HCL 4 MG PO TABS: 4.0000 mg | ORAL_TABLET | ORAL | Status: DC | PRN

## 2021-08-24 MED ORDER — PRENATAL MULTIVITAMIN CH
1.0000 | ORAL_TABLET | Freq: Every day | ORAL | Status: DC
  Administered 2021-08-24 – 2021-08-26 (×3): 1 via ORAL
  Filled 2021-08-24 (×3): qty 1

## 2021-08-24 MED ORDER — WITCH HAZEL-GLYCERIN EX PADS: 1.0000 | MEDICATED_PAD | CUTANEOUS | Status: DC | PRN

## 2021-08-24 MED ORDER — DIPHENHYDRAMINE HCL 25 MG PO CAPS: 25.0000 mg | ORAL_CAPSULE | Freq: Four times a day (QID) | ORAL | Status: DC | PRN

## 2021-08-24 MED ORDER — BENZOCAINE-MENTHOL 20-0.5 % EX AERO: 1.0000 | INHALATION_SPRAY | CUTANEOUS | Status: DC | PRN

## 2021-08-24 MED ORDER — IBUPROFEN 600 MG PO TABS
600.0000 mg | ORAL_TABLET | Freq: Four times a day (QID) | ORAL | Status: DC
  Administered 2021-08-24 – 2021-08-26 (×9): 600 mg via ORAL
  Filled 2021-08-24 (×9): qty 1

## 2021-08-24 MED ORDER — IBUPROFEN 600 MG PO TABS: 600.0000 mg | ORAL_TABLET | Freq: Once | ORAL | Status: DC

## 2021-08-24 MED ORDER — DIBUCAINE (PERIANAL) 1 % EX OINT: 1.0000 | TOPICAL_OINTMENT | CUTANEOUS | Status: DC | PRN

## 2021-08-24 MED ORDER — ACETAMINOPHEN 325 MG PO TABS
650.0000 mg | ORAL_TABLET | ORAL | Status: DC | PRN
  Administered 2021-08-25: 650 mg via ORAL
  Filled 2021-08-24: qty 2

## 2021-08-24 MED ORDER — SIMETHICONE 80 MG PO CHEW: 80.0000 mg | CHEWABLE_TABLET | ORAL | Status: DC | PRN

## 2021-08-24 MED ORDER — ONDANSETRON HCL 4 MG/2ML IJ SOLN: 4.0000 mg | INTRAMUSCULAR | Status: DC | PRN

## 2021-08-24 MED ORDER — COCONUT OIL OIL: 1.0000 | TOPICAL_OIL | Status: DC | PRN

## 2021-08-24 MED ORDER — MAGNESIUM HYDROXIDE 400 MG/5ML PO SUSP
30.0000 mL | ORAL | Status: DC | PRN
  Administered 2021-08-25: 30 mL via ORAL
  Filled 2021-08-24: qty 30

## 2021-08-24 NOTE — Procedures (Signed)
  Post-Placental IUD Insertion Procedure Note  Patient identified, informed consent signed prior to delivery, signed copy in chart, time out was performed.    Vaginal, labial and perineal areas thoroughly inspected for lacerations. Perineum intact.  Mirena  - IUD grasped between sterile gloved fingers. Sterile lubrication applied to sterile gloved hand for ease of insertion. Fundus identified through abdominal wall using non-insertion hand. IUD inserted to fundus with bimanual technique. IUD carefully released at the fundus and insertion hand gently removed from vagina.    Strings trimmed to the level of the introitus. Patient tolerated procedure well.  Patient given post procedure instructions and IUD care card with expiration date.  Patient is asked to keep IUD strings tucked in her vagina until her postpartum follow up visit in 4-6 weeks. Patient advised to abstain from sexual intercourse and pulling on strings before her follow-up visit. Patient verbalized an understanding of the plan of care and agrees.   Clayton Bibles, MSA, MSN, CNM Certified Nurse Midwife, Biochemist, clinical for [employer], [facility]

## 2021-08-24 NOTE — Discharge Instructions (Signed)

## 2021-08-24 NOTE — Discharge Summary (Signed)
Postpartum Discharge Summary  Date of Service updated***     Patient Name: Sherry [name] DOB: [birth year] MRN: [id number]  Date of admission: 08/23/2021 Delivery date:08/24/2021  Delivering provider: Darlina Rumpf  Date of discharge: 08/24/2021  Admitting diagnosis: Labor and delivery indication for care or intervention [O75.9] Intrauterine pregnancy: [redacted]w[redacted]d     Secondary diagnosis:  Principal Problem:   Labor and delivery indication for care or intervention Active Problems:   Vaginal delivery   Encounter for insertion of mirena IUD  Additional problems: N/A    Discharge diagnosis: Term Pregnancy Delivered                                              Post partum procedures: Postplacental IUD insertion (Mirena) Augmentation: AROM, Pitocin, Cytotec, and IP Foley Complications: None  Hospital course: Induction of Labor With Vaginal Delivery   36 y.o. yo G3P1011 at [redacted]w[redacted]d was admitted to the hospital 08/23/2021 for induction of labor.  Indication for induction: AMA.  Patient had an uncomplicated labor course as follows: Membrane Rupture Time/Date: 6:02 PM ,08/23/2021   Delivery Method:Vaginal, Spontaneous  Episiotomy: None  Lacerations:  None  Details of delivery can be found in separate delivery note.  Patient had a routine postpartum course. Patient is discharged home 08/24/21.  Newborn Data: Birth date:08/24/2021  Birth time:12:37 AM  Gender:Female  Living status:Living  Apgars:9 ,9  Weight:   Magnesium Sulfate received: No BMZ received: No Rhophylac:N/A MMR:N/A T-DaP:Given prenatally (06/06/2021) Flu: N/A Transfusion:No  Physical exam  Vitals:   08/23/21 2350 08/24/21 0000 08/24/21 0045 08/24/21 0100  BP:  111/77 129/74 133/61  Pulse:  (!) 101 (!) 126 (!) 127  Resp:      Temp:      TempSrc:      SpO2: 99%     Weight:      Height:       General: alert, cooperative, and no distress Lochia: appropriate Uterine Fundus: firm Incision: N/A DVT  Evaluation: No evidence of DVT seen on physical exam. Negative Homan's sign. Labs: Lab Results  Component Value Date   WBC 8.9 08/23/2021   HGB 12.1 08/23/2021   HCT 36.8 08/23/2021   MCV 84.0 08/23/2021   PLT 364 08/23/2021       No data to display         Edinburgh Score:     No data to display           After visit meds:  Allergies as of 08/24/2021   No Known Allergies   Med Rec must be completed prior to using this [facility]***        Discharge home in stable condition Infant Feeding: Breast Infant Disposition:home with mother Discharge instruction: per After Visit Summary and Postpartum booklet. Activity: Advance as tolerated. Pelvic rest for 6 weeks.  Diet: routine diet Future Appointments:No future appointments. Follow up Visit: Message sent by Maryelizabeth Kaufmann, CNM in 08/24/2021  Please schedule this patient for a In person postpartum visit in 4 weeks with the following provider:  please add to Sam's schedule if possible . Additional Postpartum F/U: IUD string check   High risk pregnancy complicated by:  AMA Delivery mode:  Vaginal, Spontaneous  Anticipated Birth Control:  PP IUD placed   08/24/2021 Darlina Rumpf, CNM

## 2021-08-24 NOTE — Lactation Note (Signed)
This note was copied from a baby's chart. Lactation Consultation Note  Patient Name: Sherry [name] Date: 08/24/2021 Reason for consult: Follow-up assessment;Term Age:36 hours   LC Note:  RN requested a lactation consult.  Birth parent eating breakfast and "[name]" was being held by a visitor.  She was swaddled and asleep.  Birth parent concerned that baby is not "getting anything" from her breast.  She has asked the RN about formula and requested to begin pumping.  RN set up the electric breast pump.  Reviewed education taught this morning on first visit.  Encouraged birth parent to latch prior to giving any supplementation or pumping.  Helped birth parent alleviate her anxiety about baby's feeding progress so far.  Reassurance given.  Advised birth parent to latch if "[name]" shows cues and that she does not require any formula supplementation at this time unless parents desire.  Birth parent verbalized understanding and will not give formula at this feeding.  She knows to feed back any expressed colostrum drops obtained to "[name]."  RN updated.   Maternal Data    Feeding    LATCH Score                    Lactation Tools Discussed/Used    Interventions Interventions: Education;Breast feeding basics reviewed  Discharge    Consult Status Consult Status: Follow-up Date: 08/25/21 Follow-up type: In-patient    Orest Dygert R Alanah Sakuma 08/24/2021, 9:55 AM

## 2021-08-24 NOTE — Lactation Note (Addendum)
This note was copied from a baby's chart. Lactation Consultation Note  Patient Name: Sherry [name] OILNZ'V Date: 08/24/2021 Reason for consult: L&D Initial assessment;Term Age:36 hours  BF parent was trying to latch baby when LC came into rm. Baby latched well. Suggested bring baby closer to obtain deeper latch. Baby BF well. Answered BF parents questions, she wanted to know if anything is coming out and did she have any milk, and when was her milk coming. BF parent has 69 yr old that she BF for 10 months. Congratulated BF parent. Lactation will f/u on MBU.  Maternal Data Has patient been taught Hand Expression?: Yes Does the patient have breastfeeding experience prior to this delivery?: Yes How long did the patient breastfeed?: 10 months to her 36 yr old  Feeding    LATCH Score Latch: Grasps breast easily, tongue down, lips flanged, rhythmical sucking.  Audible Swallowing: None  Type of Nipple: Everted at rest and after stimulation  Comfort (Breast/Nipple): Soft / non-tender  Hold (Positioning): No assistance needed to correctly position infant at breast.  LATCH Score: 8   Lactation Tools Discussed/Used    Interventions Interventions: Adjust position;Skin to skin  Discharge    Consult Status Consult Status: Follow-up from L&D Date: 08/24/21 Follow-up type: In-patient    Charyl Dancer 08/24/2021, 1:49 AM

## 2021-08-24 NOTE — Anesthesia Postprocedure Evaluation (Signed)
Anesthesia Post Note  Patient: Sherry [name]  Procedure(s) Performed: AN AD HOC LABOR EPIDURAL     Patient location during evaluation: Mother Baby Anesthesia Type: Epidural Level of consciousness: awake and alert Pain management: pain level controlled Vital Signs Assessment: post-procedure vital signs reviewed and stable Respiratory status: spontaneous breathing, nonlabored ventilation and respiratory function stable Cardiovascular status: stable Postop Assessment: no headache, no backache and epidural receding Anesthetic complications: no   No notable events documented.  Last Vitals:  Vitals:   08/24/21 0225 08/24/21 0335  BP: 125/80 135/70  Pulse: (!) 116 (!) 101  Resp: 18 18  Temp: 36.8 C 37.8 C  SpO2:      Last Pain:  Vitals:   08/24/21 0506  TempSrc:   PainSc: 3    Pain Goal:                   [name]

## 2021-08-24 NOTE — Lactation Note (Signed)
This note was copied from a baby's chart. Lactation Consultation Note  Patient Name: Sherry [name] FIEPP'I Date: 08/24/2021 Reason for consult: Initial assessment;Term Age:36 hours   P2: Term infant at 40+0 weeks Feeding preference: Breast  RN requested latch assistance; birth parent questioning the use of formula to be sure baby is "getting enough."    Arrived to find "Sherry [name]" latch to birth parent's breast; shallow latch noted.  Offered to assist with latching; birth parent receptive.  Reviewed breast feeding basics and assisted to latch in the cross cradle hold easily.  Observed her for feeding for 6 minutes before releasing.  Placed baby STS and she began showing cues again.  Assisted to re-latch; support person at bedside and will continue to assist birth parent.  Emphasized the importance of frequent latching and informed parents that formula is not needed at this time.  Reassurance given.  Encouraged to feed 8-12 times/24 hours or sooner if "Sherry [name]" shows cues.  Suggested she call her RN/LC for assistance as needed.  Praised parents for their efforts.  RN updated.   Maternal Data Has patient been taught Hand Expression?: Yes Does the patient have breastfeeding experience prior to this delivery?: Yes How long did the patient breastfeed?: 10 months  Feeding Mother's Current Feeding Choice: Breast Milk  LATCH Score Latch: Grasps breast easily, tongue down, lips flanged, rhythmical sucking.  Audible Swallowing: None  Type of Nipple: Everted at rest and after stimulation  Comfort (Breast/Nipple): Soft / non-tender  Hold (Positioning): Assistance needed to correctly position infant at breast and maintain latch.  LATCH Score: 7   Lactation Tools Discussed/Used    Interventions Interventions: Breast feeding basics reviewed;Assisted with latch;Skin to skin;Breast massage;Hand express;Breast compression;Position options;Support pillows;Adjust position;Education;LC  Services brochure  Discharge Pump: Personal  Consult Status Consult Status: Follow-up Date: 08/25/21 Follow-up type: In-patient    Talishia Betzler R Drue Camera Spackman 08/24/2021, 5:52 AM

## 2021-08-25 NOTE — Progress Notes (Signed)
Post Partum Day 1 Subjective: Sherry [name]  is a 36 y.o. [id number] s/p SVD at [redacted]w[redacted]d.  She reports she is doing well. No acute events overnight. She denies any problems with ambulating, voiding or po intake. Denies nausea or vomiting.  Pain is well controlled on ibuprofen. Lochia is moderate and improving.   Objective: Blood pressure 122/84, pulse 84, temperature 98 F (36.7 C), resp. rate 20, height 5\' 9"  (1.753 m), weight (!) 136.4 kg, last menstrual period 11/17/2020, SpO2 97 %, unknown if currently breastfeeding.  Physical Exam:  General: alert, cooperative, and no distress Lochia: appropriate Uterine Fundus: firm, U-1 Incision: n/a DVT Evaluation: No evidence of DVT seen on physical exam. Negative Homan's sign. No cords or calf tenderness. Calf/Ankle trace edema is present.  Recent Labs    08/23/21 0837 08/24/21 0608  HGB 12.1 11.8*  HCT 36.8 35.3*    Assessment/Plan: Plan for discharge tomorrow, Breastfeeding, and Lactation consult   LOS: 2 days   10/24/21, CNM 08/25/2021, 7:19 AM

## 2021-08-25 NOTE — Progress Notes (Signed)
Pt c/o dizziness while walking around. Pt has had 3.5 h sleep in the last 2 days. RN encouraged sleep and increase water vs caffeine infused sodas. RN will reassess

## 2021-08-26 MED ORDER — IBUPROFEN 600 MG PO TABS: 600.0000 mg | ORAL_TABLET | Freq: Four times a day (QID) | ORAL | 0 refills | Status: DC

## 2021-08-26 MED ORDER — ACETAMINOPHEN 325 MG PO TABS: 650.0000 mg | ORAL_TABLET | ORAL | 0 refills | Status: DC | PRN

## 2021-08-26 MED ORDER — POLYETHYLENE GLYCOL 3350 17 GM/SCOOP PO POWD: 17.0000 g | Freq: Every day | ORAL | 1 refills | Status: DC | PRN

## 2021-08-26 NOTE — Progress Notes (Signed)
POSTPARTUM PROGRESS NOTE  Post Partum Day 2  Subjective:  Syann Cupples is a 36 y.o. [id number] s/p SVD after IOL for AMA at [redacted]w[redacted]d.  No acute events overnight.  Pt denies problems with ambulating, voiding or po intake.  She denies nausea or vomiting.  Pain is well controlled.  She has had flatus. She has had bowel movement.  Lochia Minimal.   Objective: Blood pressure 122/77, pulse 90, temperature 98.2 F (36.8 C), temperature source Oral, resp. rate 18, height 5\' 9"  (1.753 m), weight (!) 136.4 kg, last menstrual period 11/17/2020, SpO2 100 %, unknown if currently breastfeeding.  Physical Exam:  General: alert, cooperative and no distress Chest: no respiratory distress Heart:regular rate, distal pulses intact Abdomen: soft, nontender,  Uterine Fundus: firm, appropriately tender DVT Evaluation: No calf swelling or tenderness Extremities: no edema Skin: warm, dry;  Recent Labs    08/23/21 0837 08/24/21 0608  HGB 12.1 11.8*  HCT 36.8 35.3*    Assessment/Plan: Seleste Tallman is a 36 y.o. 31 s/p SVD after IOL for AMA at [redacted]w[redacted]d   PPD#2 - Doing well Contraception: IUD in place Feeding: Breast Dispo: Plan for discharge today.   LOS: 3 days   [redacted]w[redacted]d, Medical Student, CNM 08/26/2021, 8:25 AM

## 2021-08-26 NOTE — Lactation Note (Signed)
This note was copied from a baby's chart. Lactation Consultation Note  Patient Name: Sherry [name] MBTDH'R Date: 08/26/2021 Reason for consult: Follow-up assessment;Term Age:36 hours BF parent states BF going OK. LC watched latch. Gave support pillows. Baby had good latch and body alignment. Praised BF parent. BF parent stated she gets a few drops when she used DEBP for extra stimulation. LC demonstrated hand expression. Answered questions mom had. Encouraged to call for further questions or assistance as needed.  Maternal Data Has patient been taught Hand Expression?: Yes  Feeding Mother's Current Feeding Choice: Breast Milk and Formula  LATCH Score Latch: Grasps breast easily, tongue down, lips flanged, rhythmical sucking.  Audible Swallowing: A few with stimulation  Type of Nipple: Everted at rest and after stimulation (short shaft compressible)  Comfort (Breast/Nipple): Filling, red/small blisters or bruises, mild/mod discomfort (tender/intact)  Hold (Positioning): No assistance needed to correctly position infant at breast.  LATCH Score: 8   Lactation Tools Discussed/Used    Interventions Interventions: Breast feeding basics reviewed;Hand express;Support pillows;Position options;DEBP  Discharge    Consult Status Consult Status: Follow-up Date: 08/26/21 Follow-up type: In-patient    Vashawn Ekstein, Diamond Nickel 08/26/2021, 3:06 AM

## 2021-08-26 NOTE — Lactation Note (Signed)
This note was copied from a baby's chart. Lactation Consultation Note  Patient Name: Sherry [name] [id number] Date: 08/26/2021 Reason for consult: Follow-up assessment Age:36 hours   P2: Term infant at 40+0 weeks Feeding preference: Breast  Birth parent had no questions/concerns related to breast feeding.  She has supplemented with some formula due to early weight loss.  "[name]" has been latching/feeding well.  Last LATCH score was an 8; voiding/stooling.  Family has our op phone number for any concerns after discharge.  Visitors present.   Maternal Data    Feeding    LATCH Score                    Lactation Tools Discussed/Used    Interventions Interventions: Education  Discharge Discharge Education: Engorgement and breast care  Consult Status Consult Status: Complete Date: 08/26/21 Follow-up type: Call as needed    [name] 08/26/2021, 9:26 AM

## 2021-09-04 ENCOUNTER — Telehealth (HOSPITAL_COMMUNITY): Payer: Self-pay | Admitting: *Deleted

## 2021-09-04 NOTE — Telephone Encounter (Signed)
Left phone voicemail message.  Duffy Rhody, RN 09-04-2021 at 2:35pm

## 2021-09-10 DIAGNOSIS — F411 Generalized anxiety disorder: Secondary | ICD-10-CM | POA: Diagnosis not present

## 2021-09-10 DIAGNOSIS — Z638 Other specified problems related to primary support group: Secondary | ICD-10-CM | POA: Diagnosis not present

## 2021-09-13 DIAGNOSIS — Z638 Other specified problems related to primary support group: Secondary | ICD-10-CM | POA: Diagnosis not present

## 2021-09-13 DIAGNOSIS — F411 Generalized anxiety disorder: Secondary | ICD-10-CM | POA: Diagnosis not present

## 2021-09-17 DIAGNOSIS — F411 Generalized anxiety disorder: Secondary | ICD-10-CM | POA: Diagnosis not present

## 2021-09-17 DIAGNOSIS — Z638 Other specified problems related to primary support group: Secondary | ICD-10-CM | POA: Diagnosis not present

## 2021-09-19 ENCOUNTER — Telehealth: Payer: Self-pay

## 2021-09-19 NOTE — Telephone Encounter (Signed)
Home visiting nurse called answering service to report "pt [place] was 14, is going to counseling, will repeat test next week, no suicidal thoughts"

## 2021-09-24 ENCOUNTER — Encounter: Payer: Self-pay | Admitting: Radiology

## 2021-09-24 DIAGNOSIS — F411 Generalized anxiety disorder: Secondary | ICD-10-CM | POA: Diagnosis not present

## 2021-09-24 DIAGNOSIS — Z638 Other specified problems related to primary support group: Secondary | ICD-10-CM | POA: Diagnosis not present

## 2021-10-07 DIAGNOSIS — F4320 Adjustment disorder, unspecified: Secondary | ICD-10-CM | POA: Diagnosis not present

## 2021-10-09 DIAGNOSIS — Z638 Other specified problems related to primary support group: Secondary | ICD-10-CM | POA: Diagnosis not present

## 2021-10-09 DIAGNOSIS — F411 Generalized anxiety disorder: Secondary | ICD-10-CM | POA: Diagnosis not present

## 2021-10-17 ENCOUNTER — Ambulatory Visit (INDEPENDENT_AMBULATORY_CARE_PROVIDER_SITE_OTHER): Payer: BC Managed Care – PPO | Admitting: Advanced Practice Midwife

## 2021-10-17 DIAGNOSIS — Z30431 Encounter for routine checking of intrauterine contraceptive device: Secondary | ICD-10-CM

## 2021-10-17 MED ORDER — FLUCONAZOLE 150 MG PO TABS: 150.0000 mg | ORAL_TABLET | Freq: Once | ORAL | 0 refills | Status: AC

## 2021-10-17 NOTE — Progress Notes (Signed)
    [place] Partum Visit Note  Sherry [name] is a 36 y.o. G29P2012 female who presents for a postpartum visit. She is 7 weeks postpartum following a normal spontaneous vaginal delivery.  I have fully reviewed the prenatal and intrapartum course. The delivery was at 40 gestational weeks.  Anesthesia: epidural. Postpartum course has been unremarkable. Baby is doing well. Baby is feeding by bottle - Similac 360 . Bleeding staining only. Bowel function is normal. Bladder function is normal. Patient is sexually active. Contraception method is IUD. Postpartum depression screening: negative.   The pregnancy intention screening data noted above was reviewed. Potential methods of contraception were discussed. The patient elected to proceed with No data recorded.    Health Maintenance Due  Topic Date Due   COVID-19 Vaccine (2 - Pfizer series) 02/14/2020   INFLUENZA VACCINE  08/13/2021    [name], current medications, past family history, past medical history, past social history, past surgical history, and problem list.  Review of Systems Pertinent items are noted in HPI.  Objective:  There were no vitals taken for this visit.   General:  alert, cooperative, appears stated age, and no distress   Breasts:  not indicated  Lungs: clear to auscultation bilaterally  Heart:  regular rate and rhythm, S1, S2 normal, no murmur, click, rub or gallop  Abdomen: soft, non-tender; bowel sounds normal; no masses,  no organomegaly   GU exam:  normal Thick white discharge suspicious for yeast       Assessment:   Vaginal delivery Postplacental IUD in place. Strings trimmed to appropriate length 3.   Normal postpartum exam.   Plan:   Essential components of care per ACOG recommendations:  1.  Mood and well being: Patient with negative depression screening today. Reviewed local resources for support.  - Patient  tobacco use? No.   - hx of drug use? No.    2. Infant care and feeding:  -Patient currently breastmilk feeding? Yes. Reviewed importance of draining breast regularly to support lactation.  -Social determinants of health (SDOH) reviewed in EPIC. No concerns  3. Sexuality, contraception and birth spacing - Postplacental IUD  4. Sleep and fatigue -Encouraged family/partner/community support of 4 hrs of uninterrupted sleep to help with mood and fatigue  5. Physical Recovery  - Discussed patients delivery and complications. She describes her labor as good. - Patient had a Vaginal, no problems at delivery. Patient had an intact perineum.  - Patient has urinary incontinence? No. - Patient is safe to resume physical and sexual activity  6.  Health Maintenance - HM due items addressed Yes - Last pap smear  Diagnosis  Date Value Ref Range Status  06/07/2020   Final   - Negative for intraepithelial lesion or malignancy (NILM)   Pap smear not done at today's visit.  -Breast Cancer screening indicated? No.   7. Chronic Disease/Pregnancy Condition follow up: N/A  - PCP follow up  Mallie Snooks, [place], MSN, CNM Certified Nurse Midwife, [facility] for [employer], [place]

## 2021-10-23 DIAGNOSIS — F411 Generalized anxiety disorder: Secondary | ICD-10-CM | POA: Diagnosis not present

## 2021-10-23 DIAGNOSIS — Z638 Other specified problems related to primary support group: Secondary | ICD-10-CM | POA: Diagnosis not present

## 2021-10-29 ENCOUNTER — Encounter: Payer: Self-pay | Admitting: Advanced Practice Midwife

## 2021-10-29 DIAGNOSIS — Z638 Other specified problems related to primary support group: Secondary | ICD-10-CM | POA: Diagnosis not present

## 2021-10-29 DIAGNOSIS — F411 Generalized anxiety disorder: Secondary | ICD-10-CM | POA: Diagnosis not present

## 2021-10-30 ENCOUNTER — Telehealth: Payer: Self-pay

## 2021-10-30 NOTE — Telephone Encounter (Signed)
Left message for pt to call office back regarding available appt.

## 2021-10-31 ENCOUNTER — Ambulatory Visit: Payer: BC Managed Care – PPO | Admitting: Advanced Practice Midwife

## 2021-11-05 DIAGNOSIS — F411 Generalized anxiety disorder: Secondary | ICD-10-CM | POA: Diagnosis not present

## 2021-11-05 DIAGNOSIS — Z638 Other specified problems related to primary support group: Secondary | ICD-10-CM | POA: Diagnosis not present

## 2021-11-11 ENCOUNTER — Encounter: Payer: Self-pay | Admitting: Advanced Practice Midwife

## 2021-11-12 DIAGNOSIS — F411 Generalized anxiety disorder: Secondary | ICD-10-CM | POA: Diagnosis not present

## 2021-11-12 DIAGNOSIS — Z638 Other specified problems related to primary support group: Secondary | ICD-10-CM | POA: Diagnosis not present

## 2021-11-14 ENCOUNTER — Ambulatory Visit (INDEPENDENT_AMBULATORY_CARE_PROVIDER_SITE_OTHER): Payer: BC Managed Care – PPO | Admitting: Advanced Practice Midwife

## 2021-11-14 VITALS — BP 132/84 | HR 105 | Wt 286.0 lb

## 2021-11-14 DIAGNOSIS — Z30017 Encounter for initial prescription of implantable subdermal contraceptive: Secondary | ICD-10-CM | POA: Diagnosis not present

## 2021-11-14 MED ORDER — ETONOGESTREL 68 MG ~~LOC~~ IMPL
68.0000 mg | DRUG_IMPLANT | Freq: Once | SUBCUTANEOUS | Status: AC
  Administered 2021-11-14: 68 mg via SUBCUTANEOUS

## 2021-11-14 NOTE — Progress Notes (Signed)
Pt presents for IUD string check and trim.

## 2021-11-14 NOTE — Progress Notes (Signed)
    GYNECOLOGY OFFICE PROCEDURE NOTE  Sherry [name] is a 36 y.o. G2I9485. Patient initially presented to have her IUD strings trimmed. On speculum exam body of IUD was visualized to be halfway protruding through cervical os.   IUD Removal Time out was performed. Speculum placed in the vagina. The strings of the IUD were grasped and pulled using ring forceps. The IUD was successfully removed in its entirety.  Patient tolerated procedure well.   Nexplanon Insertion Procedure Patient identified, informed consent performed, consent signed.   Patient does understand that irregular bleeding is a very common side effect of this medication. She was advised to have backup contraception for one week after placement. Appropriate time out taken.  Patient's left arm was prepped and draped in the usual sterile fashion. The ruler used to measure and mark insertion area.  Patient was prepped with alcohol swab and then injected with 3 ml of 1% lidocaine.  She was prepped with betadine, Nexplanon removed from packaging,  Device confirmed in needle, then inserted full length of needle and withdrawn per handbook instructions. Nexplanon was able to palpated in the patient's arm; patient palpated the insert herself. There was minimal blood loss.  Patient insertion site covered with guaze and a pressure bandage to reduce any bruising.  The patient tolerated the procedure well and was given post procedure instructions.   Mallie Snooks, [place], MSN, CNM Certified Nurse Midwife, Product/process development scientist for [employer], [place]

## 2021-11-19 DIAGNOSIS — Z638 Other specified problems related to primary support group: Secondary | ICD-10-CM | POA: Diagnosis not present

## 2021-11-19 DIAGNOSIS — F411 Generalized anxiety disorder: Secondary | ICD-10-CM | POA: Diagnosis not present

## 2021-11-28 ENCOUNTER — Ambulatory Visit: Payer: BC Managed Care – PPO | Admitting: Advanced Practice Midwife

## 2021-11-29 DIAGNOSIS — M25362 Other instability, left knee: Secondary | ICD-10-CM | POA: Diagnosis not present

## 2021-12-03 DIAGNOSIS — F4320 Adjustment disorder, unspecified: Secondary | ICD-10-CM | POA: Diagnosis not present

## 2021-12-07 ENCOUNTER — Encounter: Payer: Self-pay | Admitting: Advanced Practice Midwife

## 2021-12-09 ENCOUNTER — Other Ambulatory Visit: Payer: Self-pay | Admitting: *Deleted

## 2021-12-09 MED ORDER — VALACYCLOVIR HCL 500 MG PO TABS: 500.0000 mg | ORAL_TABLET | Freq: Every day | ORAL | 12 refills | Status: DC

## 2021-12-09 NOTE — Telephone Encounter (Signed)
erroneous

## 2021-12-10 DIAGNOSIS — F4320 Adjustment disorder, unspecified: Secondary | ICD-10-CM | POA: Diagnosis not present

## 2021-12-17 DIAGNOSIS — F4320 Adjustment disorder, unspecified: Secondary | ICD-10-CM | POA: Diagnosis not present

## 2021-12-24 DIAGNOSIS — F4320 Adjustment disorder, unspecified: Secondary | ICD-10-CM | POA: Diagnosis not present

## 2021-12-31 DIAGNOSIS — F4320 Adjustment disorder, unspecified: Secondary | ICD-10-CM | POA: Diagnosis not present

## 2022-01-14 DIAGNOSIS — F4320 Adjustment disorder, unspecified: Secondary | ICD-10-CM | POA: Diagnosis not present

## 2022-01-21 DIAGNOSIS — F4320 Adjustment disorder, unspecified: Secondary | ICD-10-CM | POA: Diagnosis not present

## 2022-01-28 ENCOUNTER — Encounter: Payer: Self-pay | Admitting: Advanced Practice Midwife

## 2022-01-31 ENCOUNTER — Encounter: Payer: Self-pay | Admitting: Family Medicine

## 2022-01-31 ENCOUNTER — Ambulatory Visit (INDEPENDENT_AMBULATORY_CARE_PROVIDER_SITE_OTHER): Payer: BC Managed Care – PPO | Admitting: Family Medicine

## 2022-01-31 VITALS — BP 129/83 | HR 87 | Wt 289.0 lb

## 2022-01-31 DIAGNOSIS — Z3009 Encounter for other general counseling and advice on contraception: Secondary | ICD-10-CM

## 2022-01-31 DIAGNOSIS — Z3046 Encounter for surveillance of implantable subdermal contraceptive: Secondary | ICD-10-CM

## 2022-01-31 DIAGNOSIS — F39 Unspecified mood [affective] disorder: Secondary | ICD-10-CM | POA: Diagnosis not present

## 2022-01-31 DIAGNOSIS — R635 Abnormal weight gain: Secondary | ICD-10-CM | POA: Diagnosis not present

## 2022-01-31 DIAGNOSIS — N921 Excessive and frequent menstruation with irregular cycle: Secondary | ICD-10-CM

## 2022-01-31 NOTE — Progress Notes (Signed)
RGYN here for Nexplanon Removal.   Pt states she will use condoms at this time.

## 2022-01-31 NOTE — Progress Notes (Signed)
Contraception/Family Planning VISIT ENCOUNTER NOTE  Subjective:   Sherry [name] is a 37 y.o. G31P2012 female here for reproductive life counseling.  Desires effectiveness from [facility].  Reports she does not want a pregnancy in the next year.  She reports irregular cycles with the Nexplanon. She does not like the bleeding and reports weight gain and mood changes with device.   Denies abnormal vaginal bleeding, discharge, pelvic pain, problems with intercourse or other gynecologic concerns.    Gynecologic History No LMP recorded. Contraception: Nexplanon  Health Maintenance Due  Topic Date Due   INFLUENZA VACCINE  08/13/2021   COVID-19 Vaccine (2 - 2023-24 season) 09/13/2021    [name], current medications, past family history, past medical history, past social history, past surgical history and problem list.  Review of Systems Pertinent items are noted in HPI.   Objective:  BP (!) 143/84   Pulse 80   Wt 289 lb (131.1 kg)   BMI 42.68 kg/m  Gen: well appearing, NAD HEENT: no scleral icterus CV: RR Lung: Normal WOB Ext: warm well perfused   Nexplanon Removal Patient identified, informed consent performed, consent signed.   Appropriate time out taken. Nexplanon site identified.  Area prepped in usual sterile fashon. 3 ml of 1% lidocaine with Epinephrine was used to anesthetize the area at the distal end of the implant and along implant site. A small stab incision was made right beside the implant on the distal portion.  The Nexplanon rod was grasped using hemostats/manual and removed with some difficulty- The device was pulled out intact with a 90 degree bend due to a mid-shaft adherent scar tissues that had to broken up.   There was minimal blood loss. There were no complications.  Steri-strips were applied over the small incision.  A pressure bandage was applied to reduce any bruising.  The  patient tolerated the procedure well and was given post procedure instructions.     Assessment and Plan:   Contraception counseling: Reviewed all forms of birth control options available including abstinence; over the counter/barrier methods; hormonal contraceptive medication including pill, patch, ring, injection,contraceptive implant; hormonal and nonhormonal IUDs; permanent sterilization options including vasectomy and the various tubal sterilization modalities. Risks and benefits reviewed.  Questions were answered.  Written information was also given to the patient to review.  Patient desires to use Condom, this was prescribed for patient. She will follow up in  1 yr for surveillance.  She was told to call with any further questions, or with any concerns about this method of contraception.  Emphasized use of condoms 100% of the time for STI prevention.  1. Encounter for other general counseling or advice on contraception Discussed vasectomy in detail at patient's request Reviewed local resources Provided local group same and website Also discussed Phexxi in detail-- if patient would like this method please send in at anytime in the next year.  Also reviewed that mood changes may be related more to delivery than her nexplanon.   2. Nexplanon removal Removed see above   Please refer to After Visit Summary for other counseling recommendations.   No follow-ups on file.  Caren Macadam, MD, MPH, ABFM Attending [place] for [facility]

## 2022-01-31 NOTE — Patient Instructions (Addendum)
[facility] [email or web address]    This is great group for a vasectomies in [place].

## 2022-02-04 DIAGNOSIS — F4320 Adjustment disorder, unspecified: Secondary | ICD-10-CM | POA: Diagnosis not present

## 2022-02-11 DIAGNOSIS — F4320 Adjustment disorder, unspecified: Secondary | ICD-10-CM | POA: Diagnosis not present

## 2022-02-18 DIAGNOSIS — F4320 Adjustment disorder, unspecified: Secondary | ICD-10-CM | POA: Diagnosis not present

## 2022-02-25 DIAGNOSIS — F4320 Adjustment disorder, unspecified: Secondary | ICD-10-CM | POA: Diagnosis not present

## 2022-03-04 DIAGNOSIS — F4320 Adjustment disorder, unspecified: Secondary | ICD-10-CM | POA: Diagnosis not present

## 2022-03-05 ENCOUNTER — Telehealth: Payer: BC Managed Care – PPO | Admitting: Physician Assistant

## 2022-03-05 DIAGNOSIS — B9689 Other specified bacterial agents as the cause of diseases classified elsewhere: Secondary | ICD-10-CM | POA: Diagnosis not present

## 2022-03-05 DIAGNOSIS — J028 Acute pharyngitis due to other specified organisms: Secondary | ICD-10-CM | POA: Diagnosis not present

## 2022-03-05 DIAGNOSIS — J40 Bronchitis, not specified as acute or chronic: Secondary | ICD-10-CM

## 2022-03-05 MED ORDER — PROMETHAZINE-DM 6.25-15 MG/5ML PO SYRP: 5.0000 mL | ORAL_SOLUTION | Freq: Four times a day (QID) | ORAL | 0 refills | Status: DC | PRN

## 2022-03-05 MED ORDER — AMOXICILLIN-POT CLAVULANATE 875-125 MG PO TABS: 1.0000 | ORAL_TABLET | Freq: Two times a day (BID) | ORAL | 0 refills | Status: DC

## 2022-03-05 MED ORDER — ALBUTEROL SULFATE HFA 108 (90 BASE) MCG/ACT IN AERS: 1.0000 | INHALATION_SPRAY | Freq: Four times a day (QID) | RESPIRATORY_TRACT | 0 refills | Status: DC | PRN

## 2022-03-05 MED ORDER — PREDNISONE 10 MG (21) PO TBPK: ORAL_TABLET | ORAL | 0 refills | Status: DC

## 2022-03-05 NOTE — Progress Notes (Signed)
Virtual Visit Consent   [name], you are scheduled for a virtual visit with a [facility] provider today. Just as with appointments in the office, your consent must be obtained to participate. Your consent will be active for this visit and any virtual visit you may have with one of our providers in the next 365 days. If you have a MyChart account, a copy of this consent can be sent to you electronically.  As this is a virtual visit, video technology does not allow for your provider to perform a traditional examination. This may limit your provider's ability to fully assess your condition. If your provider identifies any concerns that need to be evaluated in person or the need to arrange testing (such as labs, EKG, etc.), we will make arrangements to do so. Although advances in technology are sophisticated, we cannot ensure that it will always work on either your end or our end. If the connection with a video visit is poor, the visit may have to be switched to a telephone visit. With either a video or telephone visit, we are not always able to ensure that we have a secure connection.  By engaging in this virtual visit, you consent to the provision of healthcare and authorize for your insurance to be billed (if applicable) for the services provided during this visit. Depending on your insurance coverage, you may receive a charge related to this service.  I need to obtain your verbal consent now. Are you willing to proceed with your visit today? Sherry [name] has provided verbal consent on 03/05/2022 for a virtual visit (video or telephone). [name], PA-C  Date: 03/05/2022 3:09 PM  Virtual Visit via Video Note   IMar Daring, connected with  Sherry [name]  ([id number], [birth year]) on 03/05/22 at  3:00 PM EST by a video-enabled telemedicine application and verified that I am speaking with the correct person using two identifiers.  Location: Patient: Virtual Visit Location  Patient: Home Provider: Virtual Visit Location Provider: Home Office   I discussed the limitations of evaluation and management by telemedicine and the availability of in person appointments. The patient expressed understanding and agreed to proceed.    History of Present Illness: Sherry [name] is a 37 y.o. who identifies as a female who was assigned female at birth, and is being seen today for sore throat and cough.  HPI: Sore Throat  This is a new problem. The current episode started yesterday. The problem has been gradually worsening. There has been no fever. Associated symptoms include congestion, coughing, a hoarse voice, stridor and trouble swallowing (painful; left side). Pertinent negatives include no diarrhea, ear discharge, ear pain, headaches, plugged ear sensation, shortness of breath, swollen glands or vomiting. Associated symptoms comments: Hears rattling and wheezing when she breathes in. She has had no exposure to strep or mono. Treatments tried: dayquil. The treatment provided no relief.     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   Obesity affecting pregnancy, antepartum 02/08/2021   BMI 37.0-37.9, adult 02/08/2021   Maternal age 20+, multigravida, antepartum 02/08/2021   Supervision of high risk pregnancy, antepartum 01/28/2021    Allergies: No Known Allergies Medications:  Current Outpatient Medications:    albuterol (VENTOLIN HFA) 108 (90 Base) MCG/ACT inhaler,  Inhale 1-2 puffs into the lungs every 6 (six) hours as needed., Disp: 8 g, Rfl: 0   amoxicillin-clavulanate (AUGMENTIN) 875-125 MG tablet, Take 1 tablet by mouth 2 (two) times daily., Disp: 20 tablet, Rfl: 0   predniSONE (STERAPRED UNI-PAK 21 TAB) 10 MG (21) TBPK tablet, 6 day taper; take  as directed on package instructions, Disp: 21 tablet, Rfl: 0   promethazine-dextromethorphan (PROMETHAZINE-DM) 6.25-15 MG/5ML syrup, Take 5 mLs by mouth 4 (four) times daily as needed., Disp: 118 mL, Rfl: 0   acetaminophen (TYLENOL) 325 MG tablet, Take 2 tablets (650 mg total) by mouth every 4 (four) hours as needed (for pain scale < 4)., Disp: 30 tablet, Rfl: 0   ibuprofen (ADVIL) 600 MG tablet, Take 1 tablet (600 mg total) by mouth every 6 (six) hours., Disp: 30 tablet, Rfl: 0   polyethylene glycol powder (GLYCOLAX/MIRALAX) 17 GM/SCOOP powder, Take 17 g by mouth daily as needed., Disp: 510 g, Rfl: 1   Prenatal Vit-Fe Fumarate-FA (MULTIVITAMIN-PRENATAL) 27-0.8 MG TABS tablet, Take 1 tablet by mouth daily at 12 noon., Disp: , Rfl:    valACYclovir (VALTREX) 500 MG tablet, Take 1 tablet (500 mg total) by mouth daily. Can increase to twice a day for 5 days in the event of a recurrence, Disp: 30 tablet, Rfl: 12  Observations/Objective: Patient is well-developed, well-nourished in no acute distress.  Resting comfortably at home.  Head is normocephalic, atraumatic.  No labored breathing.  Speech is clear and coherent with logical content.  Patient is alert and oriented at baseline.    Assessment and Plan: 1. Acute bacterial pharyngitis - amoxicillin-clavulanate (AUGMENTIN) 875-125 MG tablet; Take 1 tablet by mouth 2 (two) times daily.  Dispense: 20 tablet; Refill: 0  2. Bronchitis - predniSONE (STERAPRED UNI-PAK 21 TAB) 10 MG (21) TBPK tablet; 6 day taper; take as directed on package instructions  Dispense: 21 tablet; Refill: 0 - albuterol (VENTOLIN HFA) 108 (90 Base) MCG/ACT inhaler; Inhale 1-2 puffs into the lungs every 6 (six) hours as needed.  Dispense: 8 g; Refill: 0 - promethazine-dextromethorphan (PROMETHAZINE-DM) 6.25-15 MG/5ML syrup; Take 5 mLs by mouth 4 (four) times daily as needed.  Dispense: 118 mL; Refill: 0  - Worsening - Will treat with Augmentin, Prednisone, Albuterol, and  Promethazine DM - Can continue Mucinex  - Push fluids.  - Rest.  - Steam and humidifier can help - Seek in person evaluation if worsening or symptoms fail to improve    Follow Up Instructions: I discussed the assessment and treatment plan with the patient. The patient was provided an opportunity to ask questions and all were answered. The patient agreed with the plan and demonstrated an understanding of the instructions.  A copy of instructions were sent to the patient via MyChart unless otherwise noted below.    The patient was advised to call back or seek an in-person evaluation if the symptoms worsen or if the condition fails to improve as anticipated.  Time:  I spent 10 minutes with the patient via telehealth technology discussing the above problems/concerns.    [name], PA-C

## 2022-03-05 NOTE — Patient Instructions (Signed)
Sherry [name], thank you for joining [name], PA-C for today's virtual visit.  While this provider is not your primary care provider (PCP), if your PCP is located in our provider database this encounter information will be shared with them immediately following your visit.   [facility] account gives you access to today's visit and all your visits, tests, and labs performed at [facility] " click here if you don't have a [facility] account or go to [email or web address]  Consent: (Patient) Sherry [name] provided verbal consent for this virtual visit at the beginning of the encounter.  Current Medications:  Current Outpatient Medications:    albuterol (VENTOLIN HFA) 108 (90 Base) MCG/ACT inhaler, Inhale 1-2 puffs into the lungs every 6 (six) hours as needed., Disp: 8 g, Rfl: 0   amoxicillin-clavulanate (AUGMENTIN) 875-125 MG tablet, Take 1 tablet by mouth 2 (two) times daily., Disp: 20 tablet, Rfl: 0   predniSONE (STERAPRED UNI-PAK 21 TAB) 10 MG (21) TBPK tablet, 6 day taper; take as directed on package instructions, Disp: 21 tablet, Rfl: 0   promethazine-dextromethorphan (PROMETHAZINE-DM) 6.25-15 MG/5ML syrup, Take 5 mLs by mouth 4 (four) times daily as needed., Disp: 118 mL, Rfl: 0   acetaminophen (TYLENOL) 325 MG tablet, Take 2 tablets (650 mg total) by mouth every 4 (four) hours as needed (for pain scale < 4)., Disp: 30 tablet, Rfl: 0   ibuprofen (ADVIL) 600 MG tablet, Take 1 tablet (600 mg total) by mouth every 6 (six) hours., Disp: 30 tablet, Rfl: 0   polyethylene glycol powder (GLYCOLAX/MIRALAX) 17 GM/SCOOP powder, Take 17 g by mouth daily as needed., Disp: 510 g, Rfl: 1   Prenatal Vit-Fe Fumarate-FA (MULTIVITAMIN-PRENATAL) 27-0.8 MG TABS tablet, Take 1 tablet by mouth daily at 12 noon., Disp: , Rfl:    valACYclovir (VALTREX) 500 MG tablet, Take 1 tablet (500 mg total) by mouth daily. Can increase to twice a day for 5 days in the event of a  recurrence, Disp: 30 tablet, Rfl: 12   Medications ordered in this encounter:  Meds ordered this encounter  Medications   amoxicillin-clavulanate (AUGMENTIN) 875-125 MG tablet    Sig: Take 1 tablet by mouth 2 (two) times daily.    Dispense:  20 tablet    Refill:  0    Order Specific Question:   Supervising Provider    Answer:   Chase Picket [id number]   predniSONE (STERAPRED UNI-PAK 21 TAB) 10 MG (21) TBPK tablet    Sig: 6 day taper; take as directed on package instructions    Dispense:  21 tablet    Refill:  0    Order Specific Question:   Supervising Provider    Answer:   Chase Picket [id number]   albuterol (VENTOLIN HFA) 108 (90 Base) MCG/ACT inhaler    Sig: Inhale 1-2 puffs into the lungs every 6 (six) hours as needed.    Dispense:  8 g    Refill:  0    Order Specific Question:   Supervising Provider    Answer:   Chase Picket [id number]   promethazine-dextromethorphan (PROMETHAZINE-DM) 6.25-15 MG/5ML syrup    Sig: Take 5 mLs by mouth 4 (four) times daily as needed.    Dispense:  118 mL    Refill:  0    Order Specific Question:   Supervising Provider    Answer:   Chase Picket [id number]     *If you need refills on other medications prior to your next  appointment, please contact your pharmacy*  Follow-Up: Call back or seek an in-person evaluation if the symptoms worsen or if the condition fails to improve as anticipated.  [place] [phone]  Other Instructions  Acute Bronchitis, Adult  Acute bronchitis is sudden inflammation of the main airways (bronchi) that come off the windpipe (trachea) in the lungs. The swelling causes the airways to get smaller and make more mucus than normal. This can make it hard to breathe and can cause coughing or noisy breathing (wheezing). Acute bronchitis may last several weeks. The cough may last longer. Allergies, asthma, and exposure to smoke may make the condition worse. What are the causes? This  condition can be caused by germs and by substances that irritate the lungs, including: Cold and flu viruses. The most common cause of this condition is the virus that causes the common cold. Bacteria. This is less common. Breathing in substances that irritate the lungs, including: Smoke from cigarettes and other forms of tobacco. Dust and pollen. Fumes from household cleaning products, gases, or burned fuel. Indoor or outdoor air pollution. What increases the risk? The following factors may make you more likely to develop this condition: A weak body's defense system, also called the immune system. A condition that affects your lungs and breathing, such as asthma. What are the signs or symptoms? Common symptoms of this condition include: Coughing. This may bring up clear, yellow, or green mucus from your lungs (sputum). Wheezing. Runny or stuffy nose. Having too much mucus in your lungs (chest congestion). Shortness of breath. Aches and pains, including sore throat or chest. How is this diagnosed? This condition is usually diagnosed based on: Your symptoms and medical history. A physical exam. You may also have other tests, including tests to rule out other conditions, such as pneumonia. These tests include: A test of lung function. Test of a mucus sample to look for the presence of bacteria. Tests to check the oxygen level in your blood. Blood tests. Chest X-ray. How is this treated? Most cases of acute bronchitis clear up over time without treatment. Your health care provider may recommend: Drinking more fluids to help thin your mucus so it is easier to cough up. Taking inhaled medicine (inhaler) to improve air flow in and out of your lungs. Using a vaporizer or a humidifier. These are machines that add water to the air to help you breathe better. Taking a medicine that thins mucus and clears congestion (expectorant). Taking a medicine that prevents or stops coughing (cough  suppressant). It is not common to take an antibiotic medicine for this condition. Follow these instructions at home:  Take over-the-counter and prescription medicines only as told by your health care provider. Use an inhaler, vaporizer, or humidifier as told by your health care provider. Take two teaspoons (10 mL) of honey at bedtime to lessen coughing at night. Drink enough fluid to keep your urine pale yellow. Do not use any products that contain nicotine or tobacco. These products include cigarettes, chewing tobacco, and vaping devices, such as e-cigarettes. If you need help quitting, ask your health care provider. Get plenty of rest. Return to your normal activities as told by your health care provider. Ask your health care provider what activities are safe for you. Keep all follow-up visits. This is important. How is this prevented? To lower your risk of getting this condition again: Wash your hands often with soap and water for at least 20 seconds. If soap and water are  not available, use hand sanitizer. Avoid contact with people who have cold symptoms. Try not to touch your mouth, nose, or eyes with your hands. Avoid breathing in smoke or chemical fumes. Breathing smoke or chemical fumes will make your condition worse. Get the flu shot every year. Contact a health care provider if: Your symptoms do not improve after 2 weeks. You have trouble coughing up the mucus. Your cough keeps you awake at night. You have a fever. Get help right away if you: Cough up blood. Feel pain in your chest. Have severe shortness of breath. Faint or keep feeling like you are going to faint. Have a severe headache. Have a fever or chills that get worse. These symptoms may represent a serious problem that is an emergency. Do not wait to see if the symptoms will go away. Get medical help right away. Call your local emergency services (911 in the [place] Do not drive yourself to the  hospital. Summary Acute bronchitis is inflammation of the main airways (bronchi) that come off the windpipe (trachea) in the lungs. The swelling causes the airways to get smaller and make more mucus than normal. Drinking more fluids can help thin your mucus so it is easier to cough up. Take over-the-counter and prescription medicines only as told by your health care provider. Do not use any products that contain nicotine or tobacco. These products include cigarettes, chewing tobacco, and vaping devices, such as e-cigarettes. If you need help quitting, ask your health care provider. Contact a health care provider if your symptoms do not improve after 2 weeks. This information is not intended to replace advice given to you by your health care provider. Make sure you discuss any questions you have with your health care provider. Document Revised: 04/11/2021 Document Reviewed: 05/02/2020 Elsevier Patient Education  [place].   Pharyngitis  Pharyngitis is inflammation of the throat (pharynx). It is a very common cause of sore throat. Pharyngitis can be caused by a bacteria, but it is usually caused by a virus. Most cases of pharyngitis get better on their own without treatment. What are the causes? This condition may be caused by: Infection by viruses (viral). Viral pharyngitis spreads easily from person to person (is contagious) through coughing, sneezing, and sharing of personal items or utensils such as cups, forks, spoons, and toothbrushes. Infection by bacteria (bacterial). Bacterial pharyngitis may be spread by touching the nose or face after coming in contact with the bacteria, or through close contact, such as kissing. Allergies. Allergies can cause buildup of mucus in the throat (post-nasal drip), leading to inflammation and irritation. Allergies can also cause blocked nasal passages, forcing breathing through the mouth, which dries and irritates the throat. What increases the  risk? You are more likely to develop this condition if: You are 76-34 years old. You are exposed to crowded environments such as daycare, school, or dormitory living. You live in a cold climate. You have a weakened disease-fighting (immune) system. What are the signs or symptoms? Symptoms of this condition vary by the cause. Common symptoms of this condition include: Sore throat. Fatigue. Low-grade fever. Stuffy nose (nasal congestion) and cough. Headache. Other symptoms may include: Glands in the neck (lymph nodes) that are swollen. Skin rashes. Plaque-like film on the throat or tonsils. This is often a symptom of bacterial pharyngitis. Vomiting. Red, itchy eyes (conjunctivitis). Loss of appetite. Joint pain and muscle aches. Enlarged tonsils. How is this diagnosed? This condition may be diagnosed based on your medical  history and a physical exam. Your health care provider will ask you questions about your illness and your symptoms. A swab of your throat may be done to check for bacteria (rapid strep test). Other lab tests may also be done, depending on the suspected cause, but these are rare. How is this treated? Many times, treatment is not needed for this condition. Pharyngitis usually gets better in 3-4 days without treatment. Bacterial pharyngitis may be treated with antibiotic medicines. Follow these instructions at home: Medicines Take over-the-counter and prescription medicines only as told by your health care provider. If you were prescribed an antibiotic medicine, take it as told by your health care provider. Do not stop taking the antibiotic even if you start to feel better. Use throat sprays to soothe your throat as told by your health care provider. Children can get pharyngitis. Do not give your child aspirin because of the association with Reye's syndrome. Managing pain To help with pain, try: Sipping warm liquids, such as broth, herbal tea, or warm water. Eating or  drinking cold or frozen liquids, such as frozen ice pops. Gargling with a mixture of salt and water 3-4 times a day or as needed. To make salt water, completely dissolve -1 tsp (3-6 g) of salt in 1 cup (237 mL) of warm water. Sucking on hard candy or throat lozenges. Putting a cool-mist humidifier in your bedroom at night to moisten the air. Sitting in the bathroom with the door closed for 5-10 minutes while you run hot water in the shower.  General instructions  Do not use any products that contain nicotine or tobacco. These products include cigarettes, chewing tobacco, and vaping devices, such as e-cigarettes. If you need help quitting, ask your health care provider. Rest as told by your health care provider. Drink enough fluid to keep your urine pale yellow. How is this prevented? To help prevent becoming infected or spreading infection: Wash your hands often with soap and water for at least 20 seconds. If soap and water are not available, use hand sanitizer. Do not touch your eyes, nose, or mouth with unwashed hands, and wash hands after touching these areas. Do not share cups or eating utensils. Avoid close contact with people who are sick. Contact a health care provider if: You have large, tender lumps in your neck. You have a rash. You cough up green, yellow-brown, or bloody mucus. Get help right away if: Your neck becomes stiff. You drool or are unable to swallow liquids. You cannot drink or take medicines without vomiting. You have severe pain that does not go away, even after you take medicine. You have trouble breathing, and it is not caused by a stuffy nose. You have new pain and swelling in your joints such as the knees, ankles, wrists, or elbows. These symptoms may represent a serious problem that is an emergency. Do not wait to see if the symptoms will go away. Get medical help right away. Call your local emergency services (911 in the [place] Do not drive yourself to the  hospital. Summary Pharyngitis is redness, pain, and swelling (inflammation) of the throat (pharynx). While pharyngitis can be caused by a bacteria, the most common causes are viral. Most cases of pharyngitis get better on their own without treatment. Bacterial pharyngitis is treated with antibiotic medicines. This information is not intended to replace advice given to you by your health care provider. Make sure you discuss any questions you have with your health care provider. Document Revised: 03/28/2020  Document Reviewed: 03/28/2020 Elsevier Patient Education  [place].    If you have been instructed to have an in-person evaluation today at a local Urgent Care facility, please use the link below. It will take you to a list of all of our available [facility], including address, phone number and hours of operation. Please do not delay care.  [facility]  If you or a family member do not have a primary care provider, use the link below to schedule a visit and establish care. When you choose a [facility] primary care physician or advanced practice provider, you gain a long-term partner in health. Find a Primary Care Provider  Learn more about [facility] in-office and virtual care options: [facility]

## 2022-03-11 DIAGNOSIS — F4320 Adjustment disorder, unspecified: Secondary | ICD-10-CM | POA: Diagnosis not present

## 2022-03-18 DIAGNOSIS — F4320 Adjustment disorder, unspecified: Secondary | ICD-10-CM | POA: Diagnosis not present

## 2022-03-25 DIAGNOSIS — F4320 Adjustment disorder, unspecified: Secondary | ICD-10-CM | POA: Diagnosis not present

## 2022-03-29 IMAGING — US US OB < 14 WEEKS - US OB TV
1 series · 14 of 28 positions shown · non-contrast
Comparison: None.

CLINICAL DATA: Fetal viability and dating.

EXAM:
OBSTETRIC <14 WK US AND TRANSVAGINAL OB US
TECHNIQUE: Both transabdominal and transvaginal ultrasound examinations were
performed for complete evaluation of the gestation as well as the
maternal uterus, adnexal regions, and pelvic cul-de-sac.
Transvaginal technique was performed to assess early pregnancy.

[Series 1: us ob less than 14 weeks with ob transvaginal · 46 acquisitions, 14 frames shown]
[im 2/46]
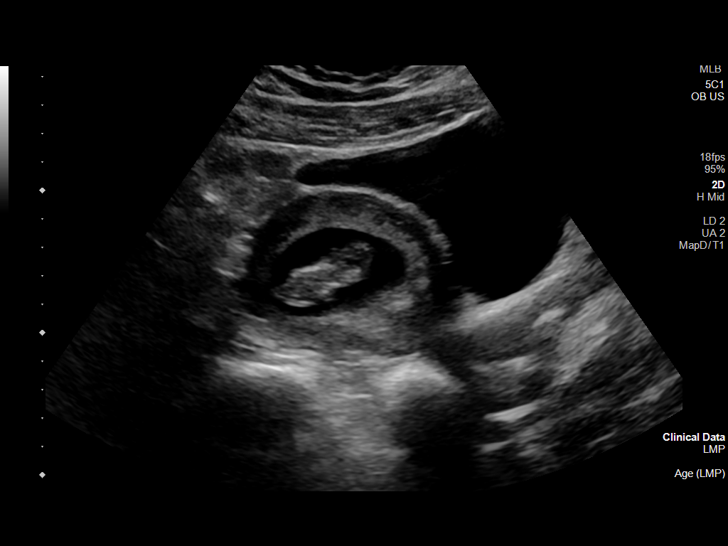
[im 6/46]
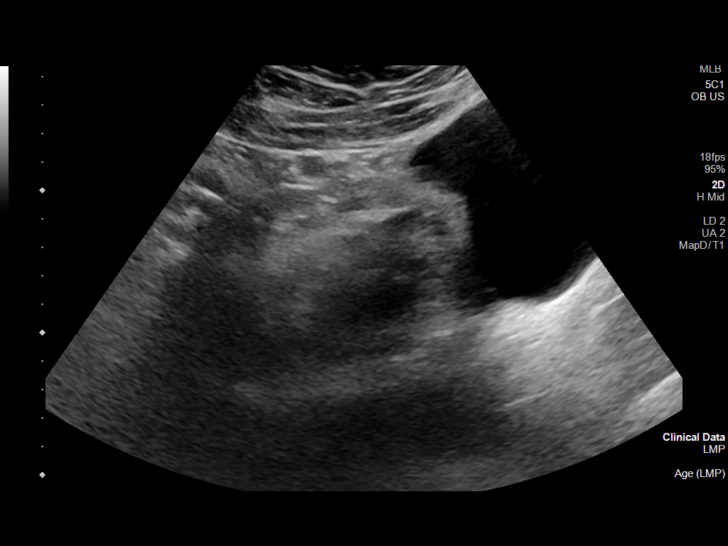
[im 9/46]
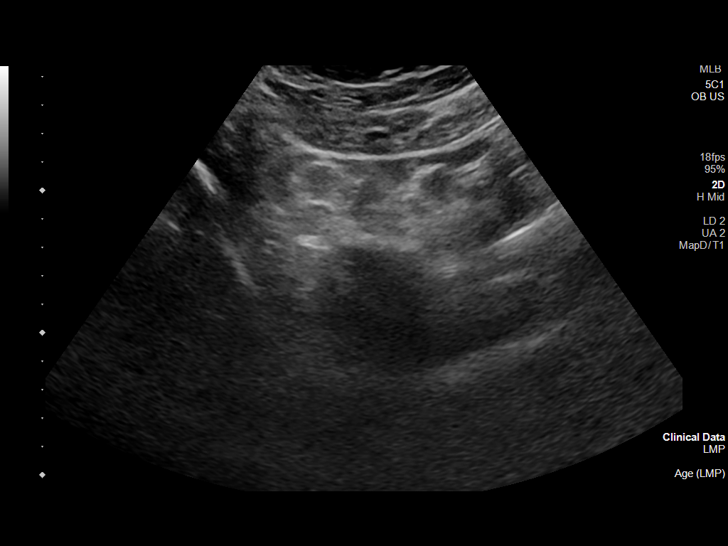
[im 12/46]
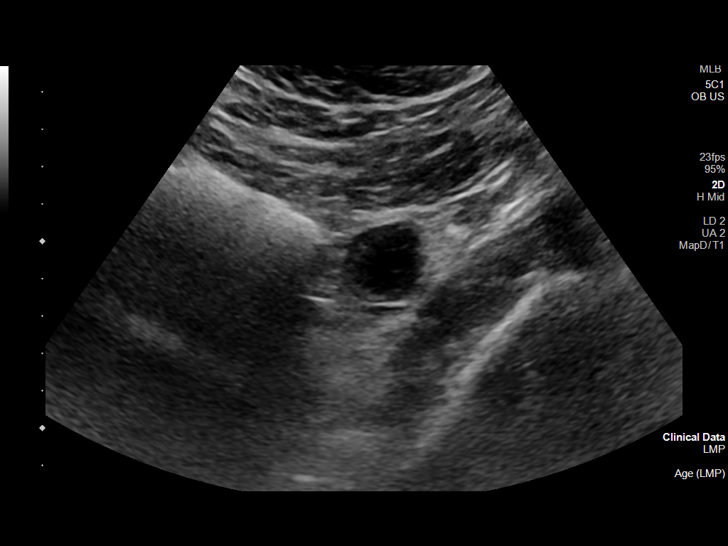
[im 16/46]
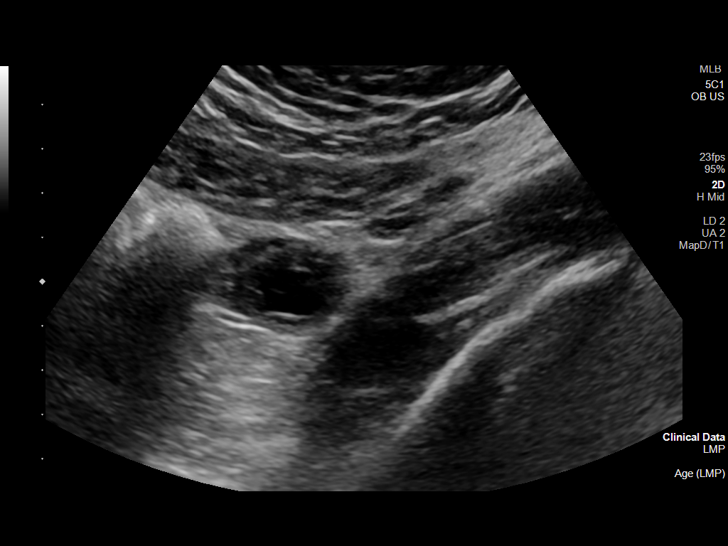
[im 19/46]
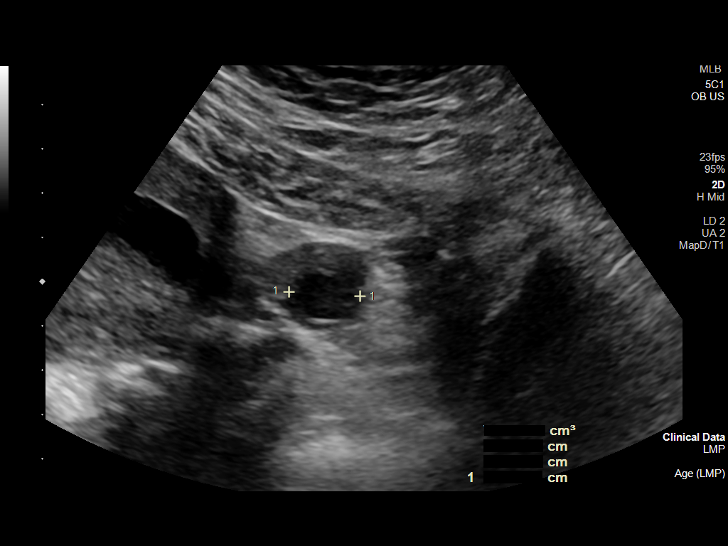
[im 22/46]
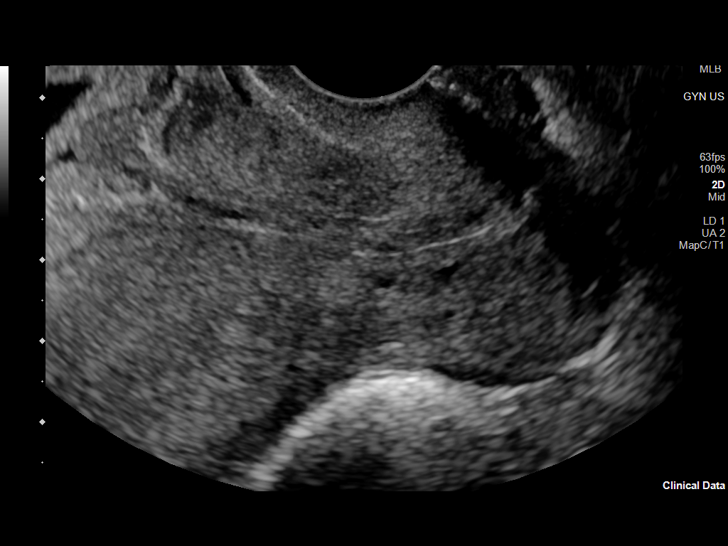
[im 26/46]
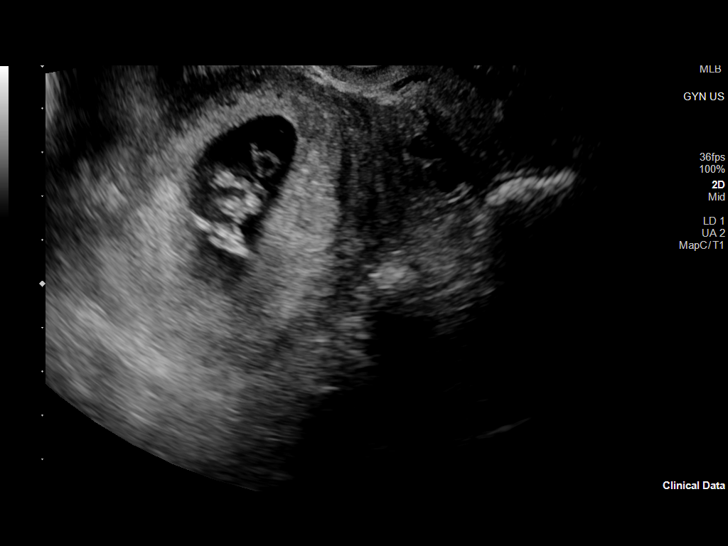
[im 29/46]
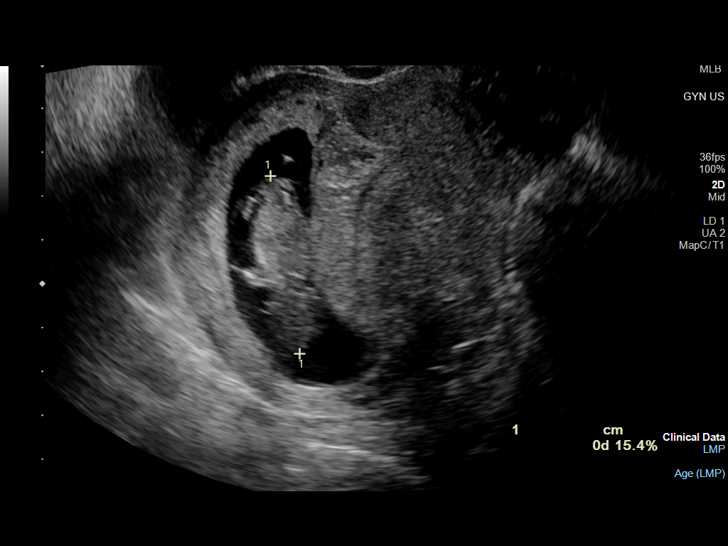
[im 32/46]
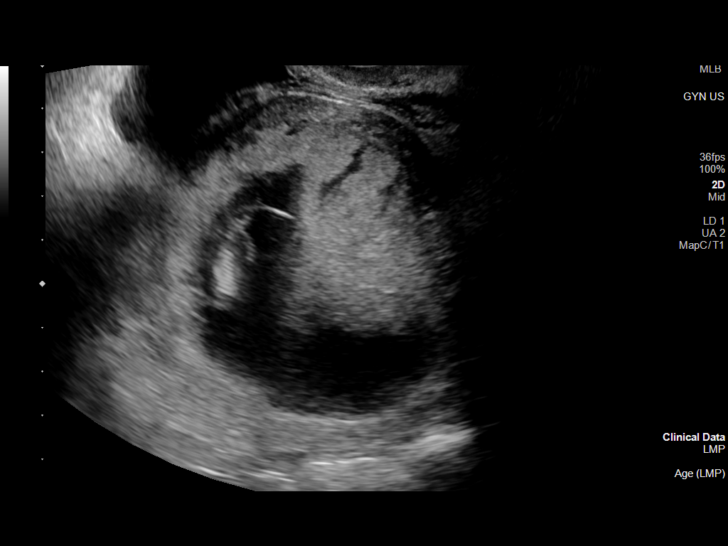
[im 36/46]
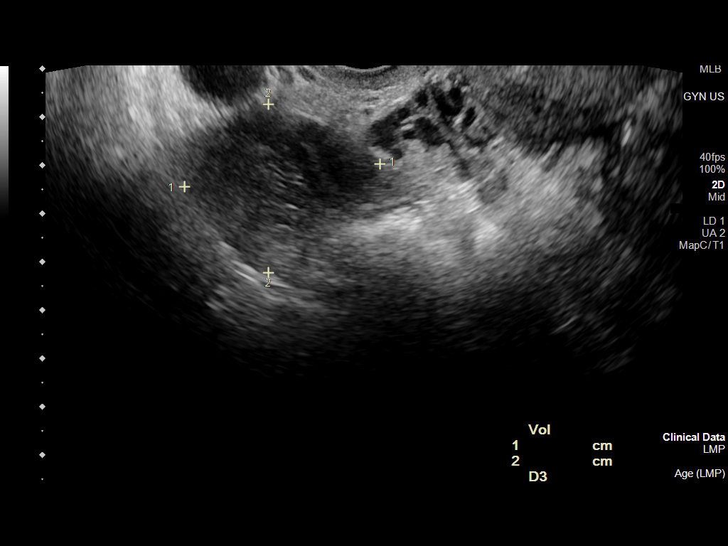
[im 39/46]
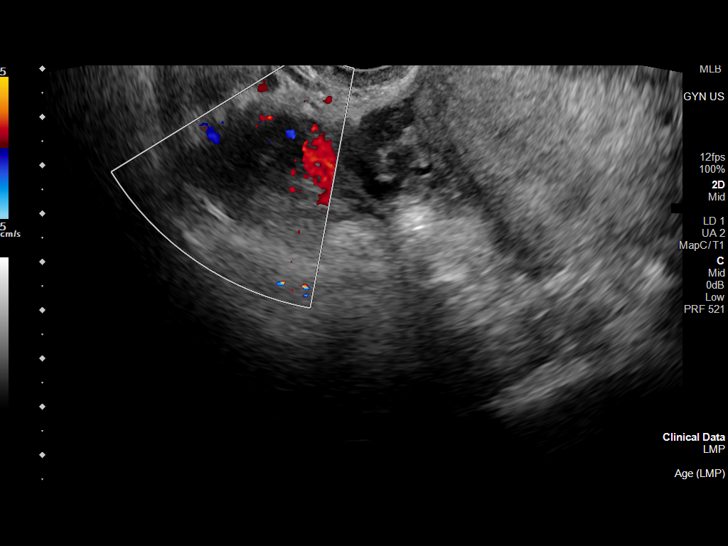
[im 42/46]
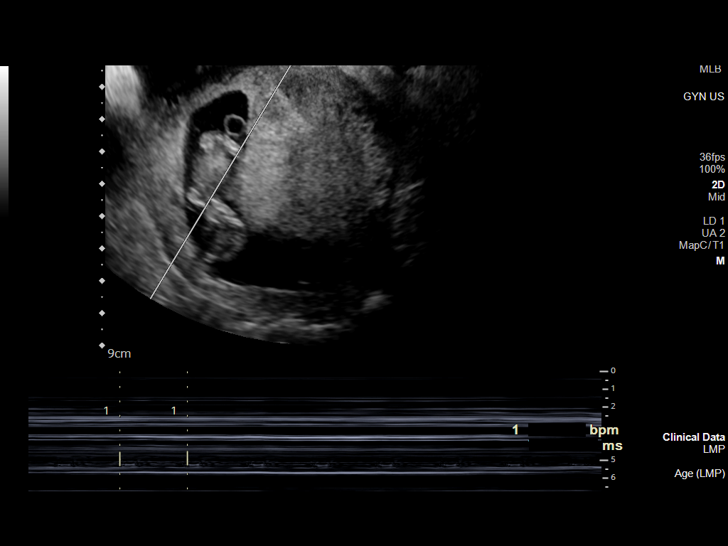
[im 46/46]
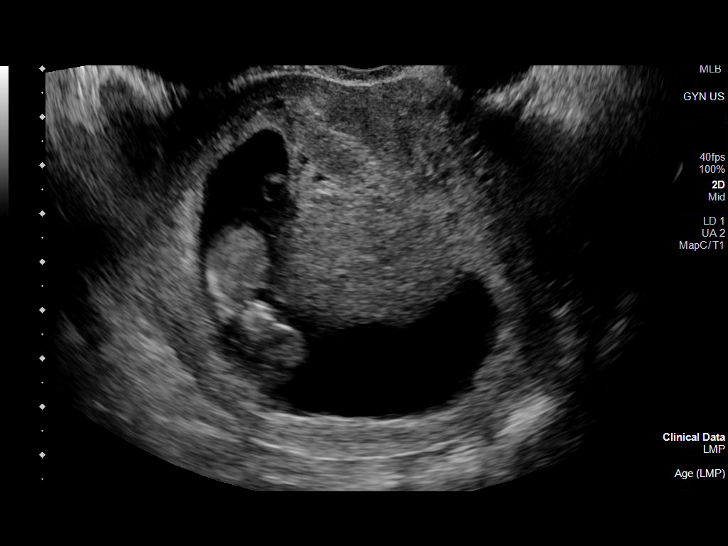

[14 of 28 positions shown; findings below may reference images not displayed]

FINDINGS: Intrauterine gestational sac: Single

Yolk sac:  Visualized.

Embryo:  Visualized.

Cardiac Activity: Visualized.

Heart Rate: 152 bpm

CRL:  41.1 mm   11 w   0 d                  US EDC: August 26, 2021.

Subchorionic hemorrhage:  None visualized.

Maternal uterus/adnexae: Right ovary is unremarkable. Probable
corpus luteum cyst seen in left ovary. No free fluid is noted.
IMPRESSION: Single live intrauterine gestation of 11 weeks 0 days.

## 2022-04-08 DIAGNOSIS — F4320 Adjustment disorder, unspecified: Secondary | ICD-10-CM | POA: Diagnosis not present

## 2022-04-15 ENCOUNTER — Telehealth: Payer: BC Managed Care – PPO | Admitting: Family Medicine

## 2022-04-15 ENCOUNTER — Other Ambulatory Visit (INDEPENDENT_AMBULATORY_CARE_PROVIDER_SITE_OTHER): Payer: Self-pay | Admitting: Physician Assistant

## 2022-04-15 ENCOUNTER — Encounter: Payer: Self-pay | Admitting: Family Medicine

## 2022-04-15 DIAGNOSIS — J039 Acute tonsillitis, unspecified: Secondary | ICD-10-CM

## 2022-04-15 DIAGNOSIS — B9689 Other specified bacterial agents as the cause of diseases classified elsewhere: Secondary | ICD-10-CM

## 2022-04-15 DIAGNOSIS — F4320 Adjustment disorder, unspecified: Secondary | ICD-10-CM | POA: Diagnosis not present

## 2022-04-15 MED ORDER — AMOXICILLIN 500 MG PO TABS: 500.0000 mg | ORAL_TABLET | Freq: Two times a day (BID) | ORAL | 0 refills | Status: AC

## 2022-04-15 MED ORDER — LIDOCAINE VISCOUS HCL 2 % MT SOLN: 15.0000 mL | OROMUCOSAL | 0 refills | Status: DC | PRN

## 2022-04-15 NOTE — Patient Instructions (Signed)
Sherry [name], thank you for joining [name], NP for today's virtual visit.  While this provider is not your primary care provider (PCP), if your PCP is located in our provider database this encounter information will be shared with them immediately following your visit.   [facility] account gives you access to today's visit and all your visits, tests, and labs performed at [facility] " click here if you don't have a [facility] account or go to [email or web address]  Consent: (Patient) Sherry [name] provided verbal consent for this virtual visit at the beginning of the encounter.  Current Medications:  Current Outpatient Medications:    acetaminophen (TYLENOL) 325 MG tablet, Take 2 tablets (650 mg total) by mouth every 4 (four) hours as needed (for pain scale < 4)., Disp: 30 tablet, Rfl: 0   albuterol (VENTOLIN HFA) 108 (90 Base) MCG/ACT inhaler, Inhale 1-2 puffs into the lungs every 6 (six) hours as needed., Disp: 8 g, Rfl: 0   amoxicillin (AMOXIL) 500 MG tablet, Take 1 tablet (500 mg total) by mouth 2 (two) times daily for 10 days., Disp: 20 tablet, Rfl: 0   ibuprofen (ADVIL) 600 MG tablet, Take 1 tablet (600 mg total) by mouth every 6 (six) hours., Disp: 30 tablet, Rfl: 0   polyethylene glycol powder (GLYCOLAX/MIRALAX) 17 GM/SCOOP powder, Take 17 g by mouth daily as needed., Disp: 510 g, Rfl: 1   predniSONE (STERAPRED UNI-PAK 21 TAB) 10 MG (21) TBPK tablet, 6 day taper; take as directed on package instructions, Disp: 21 tablet, Rfl: 0   Prenatal Vit-Fe Fumarate-FA (MULTIVITAMIN-PRENATAL) 27-0.8 MG TABS tablet, Take 1 tablet by mouth daily at 12 noon., Disp: , Rfl:    promethazine-dextromethorphan (PROMETHAZINE-DM) 6.25-15 MG/5ML syrup, Take 5 mLs by mouth 4 (four) times daily as needed., Disp: 118 mL, Rfl: 0   valACYclovir (VALTREX) 500 MG tablet, Take 1 tablet (500 mg total) by mouth daily. Can increase to twice a day for 5 days in the event of a  recurrence, Disp: 30 tablet, Rfl: 12   Medications ordered in this encounter:  Meds ordered this encounter  Medications   amoxicillin (AMOXIL) 500 MG tablet    Sig: Take 1 tablet (500 mg total) by mouth 2 (two) times daily for 10 days.    Dispense:  20 tablet    Refill:  0    Order Specific Question:   Supervising Provider    Answer:   Chase Picket [id number]     *If you need refills on other medications prior to your next appointment, please contact your pharmacy*  Follow-Up: Call back or seek an in-person evaluation if the symptoms worsen or if the condition fails to improve as anticipated.  [facility] [phone]  Other Instructions   If you do not have a PCP, [facility] offers a free physician referral service available at [phone]. Our trained staff has the experience, knowledge and resources to put you in touch with a physician who is right for you.    If you have been instructed to have an in-person evaluation today at a local Urgent Care facility, please use the link below. It will take you to a list of all of our available [facility], including address, phone number and hours of operation. Please do not delay care.  [facility]  If you or a family member do not have a primary care provider, use the link below to schedule a visit  and establish care. When you choose a [facility] primary care physician or advanced practice provider, you gain a long-term partner in health. Find a Primary Care Provider  Learn more about 's in-office and virtual care options: [facility] Now

## 2022-04-15 NOTE — Progress Notes (Signed)
Virtual Visit Consent   Sherry [name], you are scheduled for a virtual visit with a [facility] provider today. Just as with appointments in the office, your consent must be obtained [name] participate. Your consent will be active for this visit and any virtual visit you may have with one of our providers in the next 365 days. If you have a MyChart account, a copy of this consent can be sent [name] you electronically.  As this is a virtual visit, video technology does not allow for your provider [name] perform a traditional examination. This may limit your provider's ability [name] fully assess your condition. If your provider identifies any concerns that need [name] be evaluated in person or the need [name] arrange testing (such as labs, EKG, etc.), we will make arrangements to do so. Although advances in technology are sophisticated, we cannot ensure that it will always work on either your end or our end. If the connection with a video visit is poor, the visit may have [name] be switched [name] a telephone visit. With either a video or telephone visit, we are not always able [name] ensure that we have a secure connection.  By engaging in this virtual visit, you consent [name] the provision of healthcare and authorize for your insurance [name] be billed (if applicable) for the services provided during this visit. Depending on your insurance coverage, you may receive a charge related [name] this service.  I need [name] obtain your verbal consent now. Are you willing [name] proceed with your visit today? Sherry [name] has provided verbal consent on 04/15/2022 for a virtual visit (video or telephone). [name], NP  Date: 04/15/2022 11:05 AM  Virtual Visit via Video Note   I, [name], connected with  Sherry [name]  ([id number], 09-05-1985) on 04/15/22 at 11:00 AM EDT by a video-enabled telemedicine application and verified that I am speaking with the correct person using two identifiers.  Location: Patient: Virtual Visit Location Patient:  Home Provider: Virtual Visit Location Provider: Home Office   I discussed the limitations of evaluation and management by telemedicine and the availability of in person appointments. The patient expressed understanding and agreed [name] proceed.    History of Present Illness: Sherry [name] is a 37 y.o. who identifies as a female who was assigned female at birth, and is being seen today for sore throat.  Onset was Sunday 04/13/2022- after traveling [name] the beach the week prior. Sore throat, mostly on the right side. Associated symptoms are body aches, some trouble swallowing, painful [name] talk and swallow, voice changes Modifying factors are dayquil  Denies chest pain, shortness of breath, fevers, chills  Exposure [name] sick contacts- unknown  Known history of strep frequently Looking [name] get in with provider [name] see about removal of tonsils  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   Obesity affecting pregnancy, antepartum 02/08/2021   BMI 37.0-37.9, adult 02/08/2021   Maternal age 72+, multigravida, antepartum 02/08/2021   Supervision of high risk pregnancy, antepartum 01/28/2021    Allergies: No Known Allergies Medications:  Current Outpatient Medications:    acetaminophen (TYLENOL) 325 MG tablet, Take 2 tablets (650 mg total) by mouth every 4 (four) hours as needed (for pain scale < 4)., Disp: 30 tablet, Rfl: 0  albuterol (VENTOLIN HFA) 108 (90 Base) MCG/ACT inhaler, Inhale 1-2 puffs into the lungs every 6 (six) hours as needed., Disp: 8 g, Rfl: 0   amoxicillin-clavulanate (AUGMENTIN) 875-125 MG tablet, Take 1 tablet by mouth 2 (two) times daily., Disp: 20 tablet, Rfl: 0   ibuprofen (ADVIL) 600 MG tablet, Take 1 tablet (600 mg total) by mouth every 6 (six) hours.,  Disp: 30 tablet, Rfl: 0   polyethylene glycol powder (GLYCOLAX/MIRALAX) 17 GM/SCOOP powder, Take 17 g by mouth daily as needed., Disp: 510 g, Rfl: 1   predniSONE (STERAPRED UNI-PAK 21 TAB) 10 MG (21) TBPK tablet, 6 day taper; take as directed on package instructions, Disp: 21 tablet, Rfl: 0   Prenatal Vit-Fe Fumarate-FA (MULTIVITAMIN-PRENATAL) 27-0.8 MG TABS tablet, Take 1 tablet by mouth daily at 12 noon., Disp: , Rfl:    promethazine-dextromethorphan (PROMETHAZINE-DM) 6.25-15 MG/5ML syrup, Take 5 mLs by mouth 4 (four) times daily as needed., Disp: 118 mL, Rfl: 0   valACYclovir (VALTREX) 500 MG tablet, Take 1 tablet (500 mg total) by mouth daily. Can increase [name] twice a day for 5 days in the event of a recurrence, Disp: 30 tablet, Rfl: 12  Observations/Objective: Patient is well-developed, well-nourished in no acute distress.  Resting comfortably  at home.  Head is normocephalic, atraumatic.  No labored breathing.  Speech is clear and coherent with logical content.  Patient is alert and oriented at baseline.  Voice change  Assessment and Plan: 1. Pharyngitis, unspecified etiology  - amoxicillin (AMOXIL) 500 MG tablet; Take 1 tablet (500 mg total) by mouth 2 (two) times daily for 10 days.  Dispense: 20 tablet; Refill: 0   -history of tonsillitis and or strep -PCP referral number given so she can be seen and referred [name] ENT Reviewed side effects, risks and benefits of medication.    Patient acknowledged agreement and understanding of the plan.   Past Medical, Surgical, Social History, Allergies, and Medications have been Reviewed.    Follow Up Instructions: I discussed the assessment and treatment plan with the patient. The patient was provided an opportunity [name] ask questions and all were answered. The patient agreed with the plan and demonstrated an understanding of the instructions.  A copy of instructions were sent [name] the patient via MyChart unless otherwise noted below.    The  patient was advised [name] call back or seek an in-person evaluation if the symptoms worsen or if the condition fails [name] improve as anticipated.  Time:  I spent 10 minutes with the patient via telehealth technology discussing the above problems/concerns.    [name], NP

## 2022-04-15 NOTE — Addendum Note (Signed)
Addended by: [name] on: 04/15/2022 01:29 PM   Modules accepted: Orders

## 2022-04-22 DIAGNOSIS — F4320 Adjustment disorder, unspecified: Secondary | ICD-10-CM | POA: Diagnosis not present

## 2022-04-29 DIAGNOSIS — F4320 Adjustment disorder, unspecified: Secondary | ICD-10-CM | POA: Diagnosis not present

## 2022-05-07 DIAGNOSIS — F4320 Adjustment disorder, unspecified: Secondary | ICD-10-CM | POA: Diagnosis not present

## 2022-05-27 DIAGNOSIS — F4320 Adjustment disorder, unspecified: Secondary | ICD-10-CM | POA: Diagnosis not present

## 2022-06-03 DIAGNOSIS — F4320 Adjustment disorder, unspecified: Secondary | ICD-10-CM | POA: Diagnosis not present

## 2022-06-13 DIAGNOSIS — F4320 Adjustment disorder, unspecified: Secondary | ICD-10-CM | POA: Diagnosis not present

## 2022-06-18 DIAGNOSIS — M25571 Pain in right ankle and joints of right foot: Secondary | ICD-10-CM | POA: Diagnosis not present

## 2022-06-18 DIAGNOSIS — M79675 Pain in left toe(s): Secondary | ICD-10-CM | POA: Diagnosis not present

## 2022-06-18 DIAGNOSIS — R600 Localized edema: Secondary | ICD-10-CM | POA: Diagnosis not present

## 2022-06-18 DIAGNOSIS — M25471 Effusion, right ankle: Secondary | ICD-10-CM | POA: Diagnosis not present

## 2022-06-24 DIAGNOSIS — F4320 Adjustment disorder, unspecified: Secondary | ICD-10-CM | POA: Diagnosis not present

## 2022-07-08 DIAGNOSIS — F4320 Adjustment disorder, unspecified: Secondary | ICD-10-CM | POA: Diagnosis not present

## 2022-07-19 ENCOUNTER — Telehealth: Payer: BC Managed Care – PPO | Admitting: Family Medicine

## 2022-07-19 DIAGNOSIS — R059 Cough, unspecified: Secondary | ICD-10-CM | POA: Diagnosis not present

## 2022-07-19 DIAGNOSIS — R070 Pain in throat: Secondary | ICD-10-CM | POA: Diagnosis not present

## 2022-07-19 DIAGNOSIS — J40 Bronchitis, not specified as acute or chronic: Secondary | ICD-10-CM | POA: Diagnosis not present

## 2022-07-19 MED ORDER — AMOXICILLIN 500 MG PO CAPS: 500.0000 mg | ORAL_CAPSULE | Freq: Two times a day (BID) | ORAL | 0 refills | Status: AC

## 2022-07-19 MED ORDER — FLUTICASONE PROPIONATE 50 MCG/ACT NA SUSP: 2.0000 | Freq: Every day | NASAL | 0 refills | Status: DC

## 2022-07-19 MED ORDER — GUAIFENESIN 200 MG PO TABS: 200.0000 mg | ORAL_TABLET | ORAL | 0 refills | Status: AC | PRN

## 2022-07-19 NOTE — Patient Instructions (Signed)
Sherry [name], thank you for joining Reed Pandy, PA-C for today's virtual visit.  While this provider is not your primary care provider (PCP), if your PCP is located in our provider database this encounter information will be shared with them immediately following your visit.   A [facility] MyChart account gives you access to today's visit and all your visits, tests, and labs performed at [facility] " click here if you don't have a [facility] MyChart account or go to [email or web address]  Consent: (Patient) Sherry [name] provided verbal consent for this virtual visit at the beginning of the encounter.  Current Medications:  Current Outpatient Medications:    amoxicillin (AMOXIL) 500 MG capsule, Take 1 capsule (500 mg total) by mouth 2 (two) times daily for 10 days., Disp: 20 capsule, Rfl: 0   fluticasone (FLONASE) 50 MCG/ACT nasal spray, Place 2 sprays into both nostrils daily for 14 days., Disp: 16 g, Rfl: 0   guaiFENesin 200 MG tablet, Take 1 tablet (200 mg total) by mouth every 4 (four) hours as needed for up to 5 days for cough or to loosen phlegm., Disp: 30 tablet, Rfl: 0   acetaminophen (TYLENOL) 325 MG tablet, Take 2 tablets (650 mg total) by mouth every 4 (four) hours as needed (for pain scale < 4)., Disp: 30 tablet, Rfl: 0   albuterol (VENTOLIN HFA) 108 (90 Base) MCG/ACT inhaler, Inhale 1-2 puffs into the lungs every 6 (six) hours as needed., Disp: 8 g, Rfl: 0   ibuprofen (ADVIL) 600 MG tablet, Take 1 tablet (600 mg total) by mouth every 6 (six) hours., Disp: 30 tablet, Rfl: 0   lidocaine (XYLOCAINE) 2 % solution, Use as directed 15 mLs in the mouth or throat as needed for mouth pain., Disp: 100 mL, Rfl: 0   polyethylene glycol powder (GLYCOLAX/MIRALAX) 17 GM/SCOOP powder, Take 17 g by mouth daily as needed., Disp: 510 g, Rfl: 1   predniSONE (STERAPRED UNI-PAK 21 TAB) 10 MG (21) TBPK tablet, 6 day taper; take as directed on package instructions, Disp: 21 tablet,  Rfl: 0   Prenatal Vit-Fe Fumarate-FA (MULTIVITAMIN-PRENATAL) 27-0.8 MG TABS tablet, Take 1 tablet by mouth daily at 12 noon., Disp: , Rfl:    promethazine-dextromethorphan (PROMETHAZINE-DM) 6.25-15 MG/5ML syrup, Take 5 mLs by mouth 4 (four) times daily as needed., Disp: 118 mL, Rfl: 0   valACYclovir (VALTREX) 500 MG tablet, Take 1 tablet (500 mg total) by mouth daily. Can increase to twice a day for 5 days in the event of a recurrence, Disp: 30 tablet, Rfl: 12   Medications ordered in this encounter:  Meds ordered this encounter  Medications   guaiFENesin 200 MG tablet    Sig: Take 1 tablet (200 mg total) by mouth every 4 (four) hours as needed for up to 5 days for cough or to loosen phlegm.    Dispense:  30 tablet    Refill:  0   fluticasone (FLONASE) 50 MCG/ACT nasal spray    Sig: Place 2 sprays into both nostrils daily for 14 days.    Dispense:  16 g    Refill:  0   amoxicillin (AMOXIL) 500 MG capsule    Sig: Take 1 capsule (500 mg total) by mouth 2 (two) times daily for 10 days.    Dispense:  20 capsule    Refill:  0     *If you need refills on other medications prior to your next appointment, please contact your pharmacy*  Follow-Up: Call back or seek an  in-person evaluation if the symptoms worsen or if the condition fails to improve as anticipated.  [facility] [phone]  Other Instructions Sore Throat A sore throat is pain, burning, irritation, or scratchiness in the throat. When you have a sore throat, you may feel pain or tenderness in your throat when you swallow or talk. Many things can cause a sore throat, including: An infection. Seasonal allergies. Dryness in the air. Irritants, such as smoke or pollution. Radiation treatment for cancer. Gastroesophageal reflux disease (GERD). A tumor. A sore throat is often the first sign of another sickness. It may happen with other symptoms, such as coughing, sneezing, fever, and swollen neck glands. Most  sore throats go away without medical treatment. Follow these instructions at home:     Medicines Take over-the-counter and prescription medicines only as told by your health care provider. Children often get sore throats. Do not give your child aspirin because of the association with Reye's syndrome. Use throat sprays to soothe your throat as told by your health care provider. Managing pain To help with pain, try: Sipping warm liquids, such as broth, herbal tea, or warm water. Eating or drinking cold or frozen liquids, such as frozen ice pops. Gargling with a mixture of salt and water 3-4 times a day or as needed. To make salt water, completely dissolve -1 tsp (3-6 g) of salt in 1 cup (237 mL) of warm water. Sucking on hard candy or throat lozenges. Putting a cool-mist humidifier in your bedroom at night to moisten the air. Sitting in the bathroom with the door closed for 5-10 minutes while you run hot water in the shower. General instructions Do not use any products that contain nicotine or tobacco. These products include cigarettes, chewing tobacco, and vaping devices, such as e-cigarettes. If you need help quitting, ask your health care provider. Rest as needed. Drink enough fluid to keep your urine pale yellow. Wash your hands often with soap and water for at least 20 seconds. If soap and water are not available, use hand sanitizer. Contact a health care provider if: You have a fever for more than 2-3 days. You have symptoms that last for more than 2-3 days. Your throat does not get better within 7 days. You have a fever and your symptoms suddenly get worse. Get help right away if: You have difficulty breathing. You cannot swallow fluids, soft foods, or your saliva. You have increased swelling in your throat or neck. You have persistent nausea and vomiting. These symptoms may represent a serious problem that is an emergency. Do not wait to see if the symptoms will go away. Get  medical help right away. Call your local emergency services (911 in the [place] Do not drive yourself to the hospital. Summary A sore throat is pain, burning, irritation, or scratchiness in the throat. Many things can cause a sore throat. Take over-the-counter medicines only as told by your health care provider. Rest as needed. Drink enough fluid to keep your urine pale yellow. Contact a health care provider if your throat does not get better within 7 days. This information is not intended to replace advice given to you by your health care provider. Make sure you discuss any questions you have with your health care provider. Document Revised: 03/28/2020 Document Reviewed: 03/28/2020 Elsevier Patient Education  2024 Else[employer]. Acute Bronchitis, Adult  Acute bronchitis is sudden inflammation of the main airways (bronchi) that come off the windpipe (trachea) in the lungs. The swelling  causes the airways to get smaller and make more mucus than normal. This can make it hard to breathe and can cause coughing or noisy breathing (wheezing). Acute bronchitis may last several weeks. The cough may last longer. Allergies, asthma, and exposure to smoke may make the condition worse. What are the causes? This condition can be caused by germs and by substances that irritate the lungs, including: Cold and flu viruses. The most common cause of this condition is the virus that causes the common cold. Bacteria. This is less common. Breathing in substances that irritate the lungs, including: Smoke from cigarettes and other forms of tobacco. Dust and pollen. Fumes from household cleaning products, gases, or burned fuel. Indoor or outdoor air pollution. What increases the risk? The following factors may make you more likely to develop this condition: A weak body's defense system, also called the immune system. A condition that affects your lungs and breathing, such as asthma. What are the signs or  symptoms? Common symptoms of this condition include: Coughing. This may bring up clear, yellow, or green mucus from your lungs (sputum). Wheezing. Runny or stuffy nose. Having too much mucus in your lungs (chest congestion). Shortness of breath. Aches and pains, including sore throat or chest. How is this diagnosed? This condition is usually diagnosed based on: Your symptoms and medical history. A physical exam. You may also have other tests, including tests to rule out other conditions, such as pneumonia. These tests include: A test of lung function. Test of a mucus sample to look for the presence of bacteria. Tests to check the oxygen level in your blood. Blood tests. Chest X-ray. How is this treated? Most cases of acute bronchitis clear up over time without treatment. Your health care provider may recommend: Drinking more fluids to help thin your mucus so it is easier to cough up. Taking inhaled medicine (inhaler) to improve air flow in and out of your lungs. Using a vaporizer or a humidifier. These are machines that add water to the air to help you breathe better. Taking a medicine that thins mucus and clears congestion (expectorant). Taking a medicine that prevents or stops coughing (cough suppressant). It is not common to take an antibiotic medicine for this condition. Follow these instructions at home:  Take over-the-counter and prescription medicines only as told by your health care provider. Use an inhaler, vaporizer, or humidifier as told by your health care provider. Take two teaspoons (10 mL) of honey at bedtime to lessen coughing at night. Drink enough fluid to keep your urine pale yellow. Do not use any products that contain nicotine or tobacco. These products include cigarettes, chewing tobacco, and vaping devices, such as e-cigarettes. If you need help quitting, ask your health care provider. Get plenty of rest. Return to your normal activities as told by your health  care provider. Ask your health care provider what activities are safe for you. Keep all follow-up visits. This is important. How is this prevented? To lower your risk of getting this condition again: Wash your hands often with soap and water for at least 20 seconds. If soap and water are not available, use hand sanitizer. Avoid contact with people who have cold symptoms. Try not to touch your mouth, nose, or eyes with your hands. Avoid breathing in smoke or chemical fumes. Breathing smoke or chemical fumes will make your condition worse. Get the flu shot every year. Contact a health care provider if: Your symptoms do not improve after 2 weeks. You  have trouble coughing up the mucus. Your cough keeps you awake at night. You have a fever. Get help right away if you: Cough up blood. Feel pain in your chest. Have severe shortness of breath. Faint or keep feeling like you are going to faint. Have a severe headache. Have a fever or chills that get worse. These symptoms may represent a serious problem that is an emergency. Do not wait to see if the symptoms will go away. Get medical help right away. Call your local emergency services (911 in the [place] Do not drive yourself to the hospital. Summary Acute bronchitis is inflammation of the main airways (bronchi) that come off the windpipe (trachea) in the lungs. The swelling causes the airways to get smaller and make more mucus than normal. Drinking more fluids can help thin your mucus so it is easier to cough up. Take over-the-counter and prescription medicines only as told by your health care provider. Do not use any products that contain nicotine or tobacco. These products include cigarettes, chewing tobacco, and vaping devices, such as e-cigarettes. If you need help quitting, ask your health care provider. Contact a health care provider if your symptoms do not improve after 2 weeks. This information is not intended to replace advice given to  you by your health care provider. Make sure you discuss any questions you have with your health care provider. Document Revised: 04/11/2021 Document Reviewed: 05/02/2020 Elsevier Patient Education  2024 Elsevier Inc.    If you have been instructed to have an in-person evaluation today at a local Urgent Care facility, please use the link below. It will take you to a list of all of our available [facility], including address, phone number and hours of operation. Please do not delay care.  [facility]  If you or a family member do not have a primary care provider, use the link below to schedule a visit and establish care. When you choose a [facility] primary care physician or advanced practice provider, you gain a long-term partner in health. Find a Primary Care Provider  Learn more about [facility]'s in-office and virtual care options: [facility] - Get Care Now

## 2022-07-19 NOTE — Progress Notes (Signed)
Virtual Visit Consent   Sherry [name], you are scheduled for a virtual visit with a [facility] provider today. Just as with appointments in the office, your consent must be obtained to participate. Your consent will be active for this visit and any virtual visit you may have with one of our providers in the next 365 days. If you have a MyChart account, a copy of this consent can be sent to you electronically.  As this is a virtual visit, video technology does not allow for your provider to perform a traditional examination. This may limit your provider's ability to fully assess your condition. If your provider identifies any concerns that need to be evaluated in person or the need to arrange testing (such as labs, EKG, etc.), we will make arrangements to do so. Although advances in technology are sophisticated, we cannot ensure that it will always work on either your end or our end. If the connection with a video visit is poor, the visit may have to be switched to a telephone visit. With either a video or telephone visit, we are not always able to ensure that we have a secure connection.  By engaging in this virtual visit, you consent to the provision of healthcare and authorize for your insurance to be billed (if applicable) for the services provided during this visit. Depending on your insurance coverage, you may receive a charge related to this service.  I need to obtain your verbal consent now. Are you willing to proceed with your visit today? Sherry [name] has provided verbal consent on 07/19/2022 for a virtual visit (video or telephone). Reed Pandy, [place]  Date: 07/19/2022 11:34 AM  Virtual Visit via Video Note   I, Reed Pandy, connected with  Sherry [name]  ([id number], 15-May-1985) on 07/19/22 at 11:30 AM EDT by a video-enabled telemedicine application and verified that I am speaking with the correct person using two identifiers.  Location: Patient: Virtual Visit Location Patient:  Home Provider: Virtual Visit Location Provider: Home Office   I discussed the limitations of evaluation and management by telemedicine and the availability of in person appointments. The patient expressed understanding and agreed to proceed.    History of Present Illness: Sherry [name] is a 37 y.o. who identifies as a female who was assigned female at birth, and is being seen today for c/o being sick the last couple of days.  Pt c/o throat pain, body aches, headache and congestion.  Pt states symptoms started on July 4th.  Pt states taking dayquil. Pt states she has chills but has not taken her temperature. Pt states she has similar symptoms to when she was seen in February.   HPI: HPI  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   Obesity affecting pregnancy, antepartum 02/08/2021   BMI 37.0-37.9, adult 02/08/2021   Maternal age 67+, multigravida, antepartum 02/08/2021   Supervision of high risk pregnancy, antepartum 01/28/2021    Allergies: No Known Allergies Medications:  Current Outpatient Medications:    amoxicillin (AMOXIL) 500 MG capsule, Take 1 capsule (500 mg total) by mouth 2 (two) times daily for 10 days., Disp: 20 capsule, Rfl: 0   fluticasone (FLONASE) 50 MCG/ACT nasal spray, Place 2 sprays into both nostrils daily for 14 days., Disp: 16 g, Rfl: 0  guaiFENesin 200 MG tablet, Take 1 tablet (200 mg total) by mouth every 4 (four) hours as needed for up to 5 days for cough or to loosen phlegm., Disp: 30 tablet, Rfl: 0   acetaminophen (TYLENOL) 325 MG tablet, Take 2 tablets (650 mg total) by mouth every 4 (four) hours as needed (for pain scale < 4)., Disp: 30 tablet, Rfl: 0   albuterol (VENTOLIN HFA) 108 (90 Base) MCG/ACT inhaler, Inhale 1-2 puffs into the  lungs every 6 (six) hours as needed., Disp: 8 g, Rfl: 0   ibuprofen (ADVIL) 600 MG tablet, Take 1 tablet (600 mg total) by mouth every 6 (six) hours., Disp: 30 tablet, Rfl: 0   lidocaine (XYLOCAINE) 2 % solution, Use as directed 15 mLs in the mouth or throat as needed for mouth pain., Disp: 100 mL, Rfl: 0   polyethylene glycol powder (GLYCOLAX/MIRALAX) 17 GM/SCOOP powder, Take 17 g by mouth daily as needed., Disp: 510 g, Rfl: 1   predniSONE (STERAPRED UNI-PAK 21 TAB) 10 MG (21) TBPK tablet, 6 day taper; take as directed on package instructions, Disp: 21 tablet, Rfl: 0   Prenatal Vit-Fe Fumarate-FA (MULTIVITAMIN-PRENATAL) 27-0.8 MG TABS tablet, Take 1 tablet by mouth daily at 12 noon., Disp: , Rfl:    promethazine-dextromethorphan (PROMETHAZINE-DM) 6.25-15 MG/5ML syrup, Take 5 mLs by mouth 4 (four) times daily as needed., Disp: 118 mL, Rfl: 0   valACYclovir (VALTREX) 500 MG tablet, Take 1 tablet (500 mg total) by mouth daily. Can increase to twice a day for 5 days in the event of a recurrence, Disp: 30 tablet, Rfl: 12  Observations/Objective: Patient is well-developed, well-nourished in no acute distress.  Resting comfortably at home.  Head is normocephalic, atraumatic.  No labored breathing. Noted congestion Speech is clear and coherent with logical content.  Patient is alert and oriented at baseline.    Assessment and Plan: 1. Throat pain in adult  2. Bronchitis - fluticasone (FLONASE) 50 MCG/ACT nasal spray; Place 2 sprays into both nostrils daily for 14 days.  Dispense: 16 g; Refill: 0 - amoxicillin (AMOXIL) 500 MG capsule; Take 1 capsule (500 mg total) by mouth 2 (two) times daily for 10 days.  Dispense: 20 capsule; Refill: 0  3. Cough in adult - guaiFENesin 200 MG tablet; Take 1 tablet (200 mg total) by mouth every 4 (four) hours as needed for up to 5 days for cough or to loosen phlegm.  Dispense: 30 tablet; Refill: 0  -Advised Pt to F/U with PCP or urgent care if symptoms persist or  worsen.  -Pt verbalized understanding.   Follow Up Instructions: I discussed the assessment and treatment plan with the patient. The patient was provided an opportunity to ask questions and all were answered. The patient agreed with the plan and demonstrated an understanding of the instructions.  A copy of instructions were sent to the patient via MyChart unless otherwise noted below.     The patient was advised to call back or seek an in-person evaluation if the symptoms worsen or if the condition fails to improve as anticipated.  Time:  I spent 15 minutes with the patient via telehealth technology discussing the above problems/concerns.    Reed Pandy, PA-C

## 2022-07-29 DIAGNOSIS — F4320 Adjustment disorder, unspecified: Secondary | ICD-10-CM | POA: Diagnosis not present

## 2022-08-03 ENCOUNTER — Encounter: Payer: Self-pay | Admitting: Obstetrics and Gynecology

## 2022-08-05 ENCOUNTER — Ambulatory Visit (INDEPENDENT_AMBULATORY_CARE_PROVIDER_SITE_OTHER): Payer: BC Managed Care – PPO | Admitting: Obstetrics & Gynecology

## 2022-08-05 ENCOUNTER — Encounter: Payer: Self-pay | Admitting: Obstetrics & Gynecology

## 2022-08-05 ENCOUNTER — Other Ambulatory Visit (HOSPITAL_COMMUNITY)
Admission: RE | Admit: 2022-08-05 | Discharge: 2022-08-05 | Disposition: A | Discharge status: Home / Self Care | Payer: BC Managed Care – PPO | Source: Ambulatory Visit | Attending: Family Medicine | Admitting: Family Medicine

## 2022-08-05 VITALS — BP 130/82 | HR 80 | Wt 297.0 lb

## 2022-08-05 DIAGNOSIS — Z01411 Encounter for gynecological examination (general) (routine) with abnormal findings: Secondary | ICD-10-CM | POA: Insufficient documentation

## 2022-08-05 DIAGNOSIS — N939 Abnormal uterine and vaginal bleeding, unspecified: Secondary | ICD-10-CM | POA: Diagnosis not present

## 2022-08-05 DIAGNOSIS — R87610 Atypical squamous cells of undetermined significance on cytologic smear of cervix (ASC-US): Secondary | ICD-10-CM | POA: Diagnosis not present

## 2022-08-05 DIAGNOSIS — Z113 Encounter for screening for infections with a predominantly sexual mode of transmission: Secondary | ICD-10-CM | POA: Diagnosis not present

## 2022-08-05 DIAGNOSIS — Z1151 Encounter for screening for human papillomavirus (HPV): Secondary | ICD-10-CM | POA: Insufficient documentation

## 2022-08-05 DIAGNOSIS — R87810 Cervical high risk human papillomavirus (HPV) DNA test positive: Secondary | ICD-10-CM | POA: Diagnosis not present

## 2022-08-05 LAB — POCT URINE PREGNANCY: Preg Test, Ur: NEGATIVE

## 2022-08-05 MED ORDER — MEGESTROL ACETATE 40 MG PO TABS
80.0000 mg | ORAL_TABLET | Freq: Two times a day (BID) | ORAL | 5 refills | Status: DC
Start: 2022-08-05 — End: 2022-10-27

## 2022-08-05 NOTE — Progress Notes (Signed)
GYNECOLOGY OFFICE VISIT NOTE  History:   Sherry [name] is a 37 y.o. G2X5284 here today for report of AUB. Had Nexplanon removed on 01/31/22. Since then, bleeding pattern has been erratic but she had an episode of bleeding that started a month ago and has not stopped.  Associated with clots, can be very heavy, wears ultra tampons and pads and bleeds through them.  Changes tampons every 2 hours.  Feels lightheaded occasionally.  Associated with cramping.  She denies any other concerns.    Past Medical History:  Diagnosis Date   Depression    PPD   Headache    Mild cervical dysplasia    Type 2 HSV infection of vulvovaginal region     Past Surgical History:  Procedure Laterality Date   DILATION AND CURETTAGE, DIAGNOSTIC / THERAPEUTIC      [name], current medications, past family history, past medical history, past social history, past surgical history and problem list.   Health Maintenance:  Normal pap and negative HRHPV on 06/07/2020.    Review of Systems:  Pertinent items noted in HPI and remainder of comprehensive ROS otherwise negative.  Physical Exam:  BP 130/82   Pulse 80   Wt 297 lb (134.7 kg)   Breastfeeding No   BMI 43.86 kg/m  CONSTITUTIONAL: Well-developed, well-nourished female in no acute distress.  HEENT:  Normocephalic, atraumatic. External right and left ear normal. No scleral icterus.  NECK: Normal range of motion, supple, no masses noted on observation SKIN: No rash noted. Not diaphoretic. No erythema. No pallor. MUSCULOSKELETAL: Normal range of motion. No edema noted. NEUROLOGIC: Alert and oriented to person, place, and time. Normal muscle tone coordination. No cranial nerve deficit noted. PSYCHIATRIC: Normal mood and affect. Normal behavior. Normal judgment and thought content. CARDIOVASCULAR: Normal heart rate noted RESPIRATORY: Effort and breath sounds normal, no  problems with respiration noted ABDOMEN: No masses noted. No other overt distention noted.   PELVIC: Normal appearing external genitalia; normal urethral meatus; normal appearing vaginal mucosa and cervix. Ongoing slow bleeding noted from uterus, wiped off with 3-4 fox swabs. Pap smear and cervicovaginal testing samples obtained.   Unable to palpate uterus and adnexa secondary to habitus. Performed in the presence of a chaperone  Labs and Imaging Results for orders placed or performed in visit on 08/05/22 (from the past 168 hour(s))  POCT urine pregnancy   Collection Time: 08/05/22 10:42 AM  Result Value Ref Range   Preg Test, Ur Negative Negative   No results found.    Assessment and Plan:     1. Abnormal uterine bleeding (AUB) Patient has abnormal uterine bleeding. She has a normal exam, no evidence of lesions.  Will order abnormal uterine bleeding evaluation labs and pelvic ultrasound to evaluate for any structural gynecologic abnormalities.  Discussed management options for abnormal uterine bleeding, recommended medical management with Megace for now. She agreed to this and this was prescribed. Bleeding precautions reviewed.  - Cervicovaginal ancillary only( [place]) - Cytology - PAP - Beta hCG quant (ref lab) - CBC - TSH Rfx on Abnormal to Free T4 - Hemoglobin A1c - US PELVIC COMPLETE WITH TRANSVAGINAL; Future - megestrol (MEGACE) 40 MG tablet; Take 2 tablets (80 mg total) by mouth 2 (two) times daily. Can increase to three tablets twice a day in the event of heavy bleeding  Dispense: 120 tablet; Refill: 5  Please refer to After Visit Summary for other  counseling recommendations.   Return for Follow up after ultrasound, possible endometrial biopsy.    I spent  40  minutes dedicated to the care of this patient including pre-visit review of records, face to face time with the patient discussing her conditions and treatments and post visit orders.    Jaynie Collins, MD,  FACOG Obstetrician & Gynecologist, [facility] [employer], [facility]

## 2022-08-06 LAB — CBC
Hematocrit: 37.4 % (ref 34.0–46.6)
Hemoglobin: 11.9 g/dL (ref 11.1–15.9)
MCH: 26.4 pg — ABNORMAL LOW (ref 26.6–33.0)
MCHC: 31.8 g/dL (ref 31.5–35.7)
MCV: 83 fL (ref 79–97)
Platelets: 369 10*3/uL (ref 150–450)
RBC: 4.51 x10E6/uL (ref 3.77–5.28)
RDW: 14.2 % (ref 11.7–15.4)
WBC: 6.3 10*3/uL (ref 3.4–10.8)

## 2022-08-06 LAB — HEMOGLOBIN A1C
Est. average glucose Bld gHb Est-mCnc: 131 mg/dL
Hgb A1c MFr Bld: 6.2 % — ABNORMAL HIGH (ref 4.8–5.6)

## 2022-08-06 LAB — CERVICOVAGINAL ANCILLARY ONLY
Bacterial Vaginitis (gardnerella): NEGATIVE
Candida Glabrata: NEGATIVE
Candida Vaginitis: NEGATIVE
Chlamydia: NEGATIVE
Comment: NEGATIVE
Comment: NEGATIVE
Comment: NEGATIVE
Comment: NEGATIVE
Comment: NEGATIVE
Comment: NORMAL
Neisseria Gonorrhea: NEGATIVE
Trichomonas: NEGATIVE

## 2022-08-06 LAB — TSH RFX ON ABNORMAL TO FREE T4: TSH: 1.14 u[IU]/mL (ref 0.450–4.500)

## 2022-08-06 LAB — BETA HCG QUANT (REF LAB): hCG Quant: <1 m[IU]/mL

## 2022-08-08 ENCOUNTER — Encounter: Payer: Self-pay | Admitting: Obstetrics & Gynecology

## 2022-08-08 ENCOUNTER — Ambulatory Visit
Admission: RE | Admit: 2022-08-08 | Discharge: 2022-08-08 | Disposition: A | Discharge status: Home / Self Care | Payer: BC Managed Care – PPO | Source: Ambulatory Visit | Attending: Obstetrics & Gynecology | Admitting: Obstetrics & Gynecology

## 2022-08-08 DIAGNOSIS — N939 Abnormal uterine and vaginal bleeding, unspecified: Secondary | ICD-10-CM | POA: Insufficient documentation

## 2022-08-08 DIAGNOSIS — N83202 Unspecified ovarian cyst, left side: Secondary | ICD-10-CM | POA: Diagnosis not present

## 2022-08-08 DIAGNOSIS — N888 Other specified noninflammatory disorders of cervix uteri: Secondary | ICD-10-CM | POA: Diagnosis not present

## 2022-08-08 DIAGNOSIS — F4320 Adjustment disorder, unspecified: Secondary | ICD-10-CM | POA: Diagnosis not present

## 2022-08-11 ENCOUNTER — Encounter: Payer: Self-pay | Admitting: Obstetrics & Gynecology

## 2022-08-11 DIAGNOSIS — R87610 Atypical squamous cells of undetermined significance on cytologic smear of cervix (ASC-US): Secondary | ICD-10-CM | POA: Insufficient documentation

## 2022-08-22 ENCOUNTER — Ambulatory Visit: Payer: BC Managed Care – PPO | Admitting: Family Medicine

## 2022-08-26 DIAGNOSIS — F4320 Adjustment disorder, unspecified: Secondary | ICD-10-CM | POA: Diagnosis not present

## 2022-08-26 NOTE — Progress Notes (Unsigned)
New patient visit   Patient: Sherry [name]   DOB: 10/11/1985   37 y.o. Female  MRN: [id number] Visit Date: 08/28/2022  Today's healthcare provider: Ronnald Ramp, MD   No chief complaint on file.  Subjective    Sherry [name] is a 37 y.o. female who presents today as a new patient to establish care.      Discussed the use of AI scribe software for clinical note transcription with the patient, who gave verbal consent to proceed.  History of Present Illness              Past Medical History:  Diagnosis Date   Depression    PPD   Headache    Mild cervical dysplasia    Type 2 HSV infection of vulvovaginal region    Past Surgical History:  Procedure Laterality Date   DILATION AND CURETTAGE, DIAGNOSTIC / THERAPEUTIC     Family Status  Relation Name Status   Mother  Alive   Father  Alive   Mat Uncle  Deceased   MGM  Alive  No partnership data on file   Family History  Problem Relation Age of Onset   Diabetes Mother    Cancer Maternal Uncle    Colon cancer Maternal Uncle    Cancer Maternal Grandmother    Kidney cancer Maternal Grandmother    Social History   Socioeconomic History   Marital status: Single    Spouse name: Not on file   Number of children: 1   Years of education: Not on file   Highest education level: Not on file  Occupational History   Not on file  Tobacco Use   Smoking status: Never    Passive exposure: Never   Smokeless tobacco: Never  Vaping Use   Vaping status: Never Used  Substance and Sexual Activity   Alcohol use: Not Currently    Comment: social    Drug use: Never   Sexual activity: Yes  Other Topics Concern   Not on file  Social History Narrative   Not on file   Social Determinants of Health   Financial Resource Strain: Low Risk  (01/28/2021)   Overall Financial Resource Strain (CARDIA)    Difficulty of Paying Living Expenses: Not hard at all  Food Insecurity: No Food Insecurity (05/03/2020)   Hunger  Vital Sign    Worried About Running Out of Food in the Last Year: Never true    Ran Out of Food in the Last Year: Never true  Transportation Needs: No Transportation Needs (05/03/2020)   PRAPARE - Administrator, Civil Service (Medical): No    Lack of Transportation (Non-Medical): No  Physical Activity: Sufficiently Active (01/28/2021)   Exercise Vital Sign    Days of Exercise per Week: 7 days    Minutes of Exercise per Session: 60 min  Stress: No Stress Concern Present (01/28/2021)   [employer] of Occupational Health - Occupational Stress Questionnaire    Feeling of Stress : Not at all  Social Connections: Moderately Integrated (01/28/2021)   Social Connection and Isolation Panel [NHANES]    Frequency of Communication with Friends and Family: More than three times a week    Frequency of Social Gatherings with Friends and Family: More than three times a week    Attends Religious Services: More than 4 times per year    Active Member of [employer] or Organizations: No    Attends Banker Meetings: Never    Marital  Status: Living with partner    Outpatient Medications Prior to Visit  Medication Sig   fluticasone (FLONASE) 50 MCG/ACT nasal spray Place 2 sprays into both nostrils daily for 14 days.   megestrol (MEGACE) 40 MG tablet Take 2 tablets (80 mg total) by mouth 2 (two) times daily. Can increase to three tablets twice a day in the event of heavy bleeding   No facility-administered medications prior to visit.   No Known Allergies  Immunization History  Administered Date(s) Administered   HPV Quadrivalent 12/13/2010, 01/10/2011, 05/30/2011   Influenza Split 10/18/2010   Influenza,inj,Quad PF,6+ Mos 03/16/2013   MMR 08/09/2010   PFIZER(Purple Top)SARS-COV-2 Vaccination 12/20/2019   PPD Test 06/07/2014, 08/06/2015   Tdap 08/09/2010, 06/06/2021    Health Maintenance  Topic Date Due   COVID-19 Vaccine (2 - 2023-24 season) 09/13/2021   INFLUENZA VACCINE   08/14/2022   PAP SMEAR-Modifier  08/04/2025   DTaP/Tdap/Td (3 - Td or Tdap) 06/07/2031   HPV VACCINES  Completed   Hepatitis C Screening  Completed   HIV Screening  Completed    Patient Care Team: Patient, No Pcp Per as PCP - General (General Practice)  Review of Systems  {Insert previous labs (optional):23779} {See past labs  Heme  Chem  Endocrine  Serology  Results Review (optional):1}   Objective    There were no vitals taken for this visit. {Insert last BP/Wt (optional):23777}{See vitals history (optional):1}    Depression Screen    05/23/2021    9:02 AM 02/07/2021    3:06 PM 05/03/2020    9:28 AM 08/11/2019    8:33 AM  PHQ 2/9 Scores  PHQ - 2 Score 0 0 0 0  PHQ- 9 Score 2 1 0 0   No results found for any visits on 08/28/22.   Physical Exam ***    Assessment & Plan      Problem List Items Addressed This Visit   None   Assessment and Plan               No follow-ups on file.      Ronnald Ramp, MD  [facility] [phone] (phone) [phone] (fax)  [facility]

## 2022-08-28 ENCOUNTER — Encounter: Payer: Self-pay | Admitting: Family Medicine

## 2022-08-28 ENCOUNTER — Ambulatory Visit (INDEPENDENT_AMBULATORY_CARE_PROVIDER_SITE_OTHER): Payer: BC Managed Care – PPO | Admitting: Family Medicine

## 2022-08-28 VITALS — BP 120/86 | HR 84 | Ht 70.0 in | Wt 294.7 lb

## 2022-08-28 DIAGNOSIS — Z7689 Persons encountering health services in other specified circumstances: Secondary | ICD-10-CM | POA: Diagnosis not present

## 2022-08-28 DIAGNOSIS — R87610 Atypical squamous cells of undetermined significance on cytologic smear of cervix (ASC-US): Secondary | ICD-10-CM | POA: Diagnosis not present

## 2022-08-28 DIAGNOSIS — R87810 Cervical high risk human papillomavirus (HPV) DNA test positive: Secondary | ICD-10-CM

## 2022-08-28 DIAGNOSIS — Z0000 Encounter for general adult medical examination without abnormal findings: Secondary | ICD-10-CM | POA: Diagnosis not present

## 2022-08-28 DIAGNOSIS — E66813 Obesity, class 3: Secondary | ICD-10-CM

## 2022-08-28 NOTE — Assessment & Plan Note (Signed)
Followed by GYN  Has biopsy scheduled for later in Aug 2024

## 2022-08-28 NOTE — Patient Instructions (Addendum)
Check with your insurance for coverage  [facility]  Saxenda  Zepbound    Contrave   Phentermine    VISIT SUMMARY:  Dear Sherry [name], during our recent visit, we discussed your concerns about weight management, prediabetes, and your history of abnormal uterine bleeding and postpartum depression. We also talked about your current lifestyle, including your work situation and dietary habits. We explored various strategies to help manage your weight and control your blood sugar levels, and discussed the importance of monitoring your health conditions.  YOUR PLAN:  -PREDIABETES: Prediabetes is a condition where your blood sugar levels are higher than normal, but not high enough to be classified as diabetes. We discussed the importance of dietary modifications, such as reducing your intake of bread, rice, and sugary drinks. We also talked about exploring insurance coverage for weight management medications and considering bariatric surgery if other strategies are unsuccessful.  -ABNORMAL UTERINE BLEEDING: Abnormal uterine bleeding is any heavy or unusual bleeding from the uterus. You've experienced this in the past, but currently, there is no recurrence. We will continue to monitor this condition and intervene if necessary.  -DEPRESSION: Depression is a mood disorder that causes a persistent feeling of sadness and loss of interest. 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 We will continue to monitor this condition and intervene if necessary.  INSTRUCTIONS:  Continue taking Valacyclovir as needed. Consider joining a weight management program such as Weight Watchers for additional support. Try to incorporate regular physical activity into your routine as much as possible. If we decide to start you on Phentermine, we will need to monitor your blood pressure regularly. Please schedule a follow-up appointment so we can continue to  monitor your progress and adjust your treatment plan as necessary.

## 2022-08-28 NOTE — Assessment & Plan Note (Signed)
-  Consider joining a weight management program such as Weight Watchers for additional support. -Encourage regular physical activity as tolerated. -Plan to follow up regularly to monitor blood pressure if Phentermine is initiated.

## 2022-08-28 NOTE — Assessment & Plan Note (Signed)
Welcomed patient to [facility]  Reviewed patient's medical history, medications, surgical and social history Discussed roles and expectations for primary care physician-patient relationship Recommended patient schedule annual preventative examinations   

## 2022-08-28 NOTE — Assessment & Plan Note (Signed)
A1c of 6.2, close to the threshold for diabetes. Patient has a history of significant weight gain and is seeking weight management strategies. -Encourage dietary modifications, specifically reducing intake of bread, rice, and sugary drinks. -Explore insurance coverage for weight management medications such as Wegovy, Zepbound, Saxenda, Contrave, and Phentermine. -Consider Phentermine as a last resort due to potential side effects of increased heart rate and blood pressure. -Consider bariatric surgery if other weight management strategies are unsuccessful and if patient meets criteria.

## 2022-08-29 ENCOUNTER — Other Ambulatory Visit: Payer: Self-pay | Admitting: Family Medicine

## 2022-08-29 DIAGNOSIS — Z0000 Encounter for general adult medical examination without abnormal findings: Secondary | ICD-10-CM

## 2022-08-29 MED ORDER — SEMAGLUTIDE-WEIGHT MANAGEMENT 1.7 MG/0.75ML ~~LOC~~ SOAJ
1.7000 mg | SUBCUTANEOUS | 0 refills | Status: DC
Start: 2022-11-24 — End: 2022-10-27

## 2022-08-29 MED ORDER — SEMAGLUTIDE-WEIGHT MANAGEMENT 0.5 MG/0.5ML ~~LOC~~ SOAJ
0.5000 mg | SUBCUTANEOUS | 0 refills | Status: AC
Start: 2022-09-27 — End: 2022-10-25

## 2022-08-29 MED ORDER — SEMAGLUTIDE-WEIGHT MANAGEMENT 1 MG/0.5ML ~~LOC~~ SOAJ
1.0000 mg | SUBCUTANEOUS | 0 refills | Status: DC
Start: 2022-10-26 — End: 2022-10-27

## 2022-08-29 NOTE — Progress Notes (Signed)
Wegovy 0.5mg , 1mg , and 1.7mg  doses sent to pharmacy for obesity with BMI of 42.29 and no success with 6 months+ of attempt for weight loss with diet and exercise changes. Patient's last A1c was 6.2 and in prediabetes range.   Ronnald Ramp, MD [facility]  08/29/22 8:46 AM

## 2022-09-11 ENCOUNTER — Other Ambulatory Visit (HOSPITAL_COMMUNITY)
Admission: RE | Admit: 2022-09-11 | Discharge: 2022-09-11 | Disposition: A | Discharge status: Home / Self Care | Payer: BC Managed Care – PPO | Source: Ambulatory Visit | Attending: Obstetrics & Gynecology | Admitting: Obstetrics & Gynecology

## 2022-09-11 ENCOUNTER — Encounter: Payer: Self-pay | Admitting: Obstetrics & Gynecology

## 2022-09-11 ENCOUNTER — Ambulatory Visit (INDEPENDENT_AMBULATORY_CARE_PROVIDER_SITE_OTHER): Payer: BC Managed Care – PPO | Admitting: Obstetrics & Gynecology

## 2022-09-11 VITALS — BP 138/85 | HR 98 | Wt 292.4 lb

## 2022-09-11 DIAGNOSIS — R87810 Cervical high risk human papillomavirus (HPV) DNA test positive: Secondary | ICD-10-CM | POA: Insufficient documentation

## 2022-09-11 DIAGNOSIS — R87610 Atypical squamous cells of undetermined significance on cytologic smear of cervix (ASC-US): Secondary | ICD-10-CM

## 2022-09-11 DIAGNOSIS — N871 Moderate cervical dysplasia: Secondary | ICD-10-CM | POA: Diagnosis not present

## 2022-09-11 DIAGNOSIS — N72 Inflammatory disease of cervix uteri: Secondary | ICD-10-CM | POA: Diagnosis not present

## 2022-09-11 NOTE — Progress Notes (Signed)
    GYNECOLOGY OFFICE COLPOSCOPY PROCEDURE NOTE  37 y.o. [id number] here for colposcopy for ASCUS with positive high risk HPV on pap done 08/05/22. Discussed role for HPV in cervical dysplasia, need for surveillance.  Patient gave informed written consent, time out was performed.  Placed in lithotomy position. Cervix viewed with speculum and colposcope after application of acetic acid.   Colposcopy adequate? Yes No visible ectocervical lesions;  no ectocervical biopsies obtained.   ECC specimen obtained, labeled and sent to pathology.  Chaperone was present during entire procedure.  Patient was given post procedure instructions.  Will follow up pathology and manage accordingly; patient will be contacted with results and recommendations.  Routine preventative health maintenance measures emphasized.    Jaynie Collins, MD, FACOG Obstetrician & Gynecologist, [facility] [employer], [facility]

## 2022-09-11 NOTE — Patient Instructions (Signed)
COLPOSCOPY POST-PROCEDURE INSTRUCTIONS  You may take Ibuprofen, Aleve or Tylenol for cramping if needed.  If Monsel's solution was used, you will have a black discharge.  Light bleeding is normal.  If bleeding is heavier than your period, please call.  Put nothing in your vagina until the bleeding or discharge stops (usually 2 or3 days).  We will call you within one week with biopsy results  

## 2022-09-16 ENCOUNTER — Encounter: Payer: Self-pay | Admitting: Family Medicine

## 2022-09-16 DIAGNOSIS — F4320 Adjustment disorder, unspecified: Secondary | ICD-10-CM | POA: Diagnosis not present

## 2022-09-18 ENCOUNTER — Encounter: Payer: Self-pay | Admitting: Obstetrics & Gynecology

## 2022-09-18 DIAGNOSIS — N871 Moderate cervical dysplasia: Secondary | ICD-10-CM | POA: Insufficient documentation

## 2022-09-18 LAB — SURGICAL PATHOLOGY

## 2022-09-25 ENCOUNTER — Encounter: Payer: Self-pay | Admitting: Obstetrics & Gynecology

## 2022-09-25 ENCOUNTER — Telehealth (INDEPENDENT_AMBULATORY_CARE_PROVIDER_SITE_OTHER): Payer: BC Managed Care – PPO | Admitting: Obstetrics & Gynecology

## 2022-09-25 DIAGNOSIS — D069 Carcinoma in situ of cervix, unspecified: Secondary | ICD-10-CM

## 2022-09-25 DIAGNOSIS — N871 Moderate cervical dysplasia: Secondary | ICD-10-CM

## 2022-09-25 NOTE — Progress Notes (Signed)
   GYNECOLOGY VIRTUAL VISIT ENCOUNTER NOTE  Provider location: [facility]   Patient location: Home  I connected with Sherry [name] on 09/25/22 at  3:10 PM EDT by MyChart Video Encounter and verified that I am speaking with the correct person using two identifiers.   I discussed the limitations, risks, security and privacy concerns of performing an evaluation and management service virtually and the availability of in person appointments. I also discussed with the patient that there may be a patient responsible charge related to this service. The patient expressed understanding and agreed to proceed.   History:  Sherry [name] is a 37 y.o. G4P2012 female being followed up today for discussion about management of high grade cervical dysplasia. She denies any abnormal vaginal discharge, bleeding, pelvic pain or other concerns.       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   Health Maintenance:  ASCUS pap and positive HRHPV on 08/05/22, followed by colposcopy that showed CIN 2-3.    Review of Systems:  Pertinent items noted in HPI and remainder of comprehensive ROS otherwise negative.  Physical Exam:   General:  Alert, oriented and cooperative. Patient appears to be in no acute distress.  Mental Status: Normal mood and affect. Normal behavior. Normal judgment and thought content.   Respiratory: Normal respiratory effort, no problems with respiration noted  Rest of physical exam deferred due to  type of encounter  09/11/22  Surgical pathology ENDOCERVIX, CURETTAGE:  Detached fragments of high-grade squamous dysplasia (HSIL, CIN-2/3) Mucohemorrhagic and inflammatory debris with admixed benign endocervical epithelium     Assessment and Plan:     1. 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 Discussed details of LEEP, all questions answered.  Patinet was offered alternative of doing this in OR, she wants to do it here in office. Recommended premedication with Ibuprofen and Tylenol.  Information given to her to discuss at home.       I discussed the assessment and treatment plan with the patient. The patient was provided an opportunity to ask questions and all were answered. The patient agreed with the plan and demonstrated an understanding of the instructions.   The patient was advised to call back or seek an in-person evaluation/go to the ED if the symptoms worsen or if the condition fails to improve as anticipated.  I provided 25 minutes of face-to-face time during this encounter.   Jaynie Collins, MD [facility] [employer], [facility]

## 2022-10-02 ENCOUNTER — Other Ambulatory Visit: Payer: Self-pay | Admitting: *Deleted

## 2022-10-02 MED ORDER — VALACYCLOVIR HCL 1 G PO TABS
1000.0000 mg | ORAL_TABLET | Freq: Every day | ORAL | 2 refills | Status: DC
Start: 2022-10-02 — End: 2022-11-24

## 2022-10-07 ENCOUNTER — Ambulatory Visit: Payer: Self-pay | Admitting: Family Medicine

## 2022-10-07 DIAGNOSIS — F4320 Adjustment disorder, unspecified: Secondary | ICD-10-CM | POA: Diagnosis not present

## 2022-10-14 ENCOUNTER — Ambulatory Visit: Payer: BC Managed Care – PPO | Admitting: Family Medicine

## 2022-10-14 DIAGNOSIS — F4320 Adjustment disorder, unspecified: Secondary | ICD-10-CM | POA: Diagnosis not present

## 2022-10-21 ENCOUNTER — Encounter: Payer: BC Managed Care – PPO | Admitting: Obstetrics & Gynecology

## 2022-10-21 DIAGNOSIS — F4320 Adjustment disorder, unspecified: Secondary | ICD-10-CM | POA: Diagnosis not present

## 2022-10-23 ENCOUNTER — Other Ambulatory Visit: Payer: Self-pay | Admitting: *Deleted

## 2022-10-23 ENCOUNTER — Encounter: Payer: Self-pay | Admitting: Obstetrics & Gynecology

## 2022-10-23 DIAGNOSIS — N939 Abnormal uterine and vaginal bleeding, unspecified: Secondary | ICD-10-CM

## 2022-10-23 MED ORDER — DOXYCYCLINE HYCLATE 100 MG PO CAPS: 100.0000 mg | ORAL_CAPSULE | Freq: Two times a day (BID) | ORAL | 0 refills | Status: AC

## 2022-10-23 MED ORDER — DOXYCYCLINE HYCLATE 100 MG PO CAPS
100.0000 mg | ORAL_CAPSULE | Freq: Two times a day (BID) | ORAL | 0 refills | Status: DC
Start: 2022-10-23 — End: 2022-10-23

## 2022-10-23 MED ORDER — TRANEXAMIC ACID 650 MG PO TABS: 1300.0000 mg | ORAL_TABLET | Freq: Three times a day (TID) | ORAL | 0 refills | Status: DC

## 2022-10-27 ENCOUNTER — Ambulatory Visit: Payer: BC Managed Care – PPO | Admitting: Obstetrics & Gynecology

## 2022-10-27 ENCOUNTER — Encounter: Payer: Self-pay | Admitting: Obstetrics & Gynecology

## 2022-10-27 ENCOUNTER — Encounter: Payer: Self-pay | Admitting: Obstetrics and Gynecology

## 2022-10-27 ENCOUNTER — Other Ambulatory Visit (HOSPITAL_COMMUNITY)
Admission: RE | Admit: 2022-10-27 | Discharge: 2022-10-27 | Disposition: A | Discharge status: Home / Self Care | Payer: BC Managed Care – PPO | Source: Ambulatory Visit | Attending: Family Medicine | Admitting: Family Medicine

## 2022-10-27 ENCOUNTER — Other Ambulatory Visit: Payer: Self-pay

## 2022-10-27 ENCOUNTER — Ambulatory Visit (INDEPENDENT_AMBULATORY_CARE_PROVIDER_SITE_OTHER): Payer: BC Managed Care – PPO | Admitting: Obstetrics and Gynecology

## 2022-10-27 VITALS — BP 121/76 | HR 86 | Ht 70.0 in | Wt 283.0 lb

## 2022-10-27 DIAGNOSIS — N871 Moderate cervical dysplasia: Secondary | ICD-10-CM

## 2022-10-27 DIAGNOSIS — Z01818 Encounter for other preprocedural examination: Secondary | ICD-10-CM | POA: Diagnosis not present

## 2022-10-27 DIAGNOSIS — N921 Excessive and frequent menstruation with irregular cycle: Secondary | ICD-10-CM | POA: Diagnosis not present

## 2022-10-27 DIAGNOSIS — R52 Pain, unspecified: Secondary | ICD-10-CM

## 2022-10-27 LAB — POCT PREGNANCY, URINE: Preg Test, Ur: NEGATIVE

## 2022-10-27 MED ORDER — IBUPROFEN 800 MG PO TABS
800.0000 mg | ORAL_TABLET | Freq: Once | ORAL | Status: AC
Start: 2022-10-27 — End: 2022-10-27
  Administered 2022-10-27: 800 mg via ORAL

## 2022-10-27 NOTE — Progress Notes (Signed)
LEEP procedure:   Patient identified, informed consent obtained, signed copy in chart, time out performed.  Pap smear and colposcopy reviewed.   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  Cervix visualized. Paracervical block placed.  Small size LOOP used to remove cone of cervix using blend of cut and cautery on LEEP machine.  Edges/Base cauterized with Ball.  Monsel's solution used for hemostasis.  Patient tolerated procedure well.  Patient given post procedure instructions.  Follow up in 1 month for LEEP bed eval and discussion of results. Pt will be scheduled back to [facility].  Sherry Aloe, MD Faculty attending Center for [facility]

## 2022-10-27 NOTE — Progress Notes (Signed)
ENDOMETRIAL BIOPSY      Sherry [name] is a 37 y.o. [id number] here for endometrial biopsy.  The indications for endometrial biopsy were reviewed.  Risks of the biopsy including cramping, bleeding, infection, uterine perforation, inadequate specimen and need for additional procedures were discussed. The patient states she understands and agrees to undergo procedure today. Consent was signed. Time out was performed.   Indications: prolonged vaginal bleeding, 10 mm endometrial stripe Urine HCG: neg  A bivalve speculum was placed into the vagina and the cervix was easily visualized and was prepped with Betadine x2. A single-toothed tenaculum was placed on the anterior lip of the cervix to stabilize it. The 3 mm pipelle was introduced into the endometrial cavity without difficulty to a depth of 8 cm, and a moderate amount of tissue was obtained and sent to pathology. This was repeated for a total of 3 passes. The instruments were removed from the patient's vagina. Minimal bleeding from the cervix at the tenaculum was noted.   The patient tolerated the procedure well. Routine post-procedure instructions were given to the patient.    Will base further management on results of biopsy.  Mariel Aloe, MD

## 2022-10-28 ENCOUNTER — Telehealth: Payer: Self-pay

## 2022-10-28 ENCOUNTER — Encounter: Payer: Self-pay | Admitting: Obstetrics & Gynecology

## 2022-10-28 DIAGNOSIS — F4320 Adjustment disorder, unspecified: Secondary | ICD-10-CM | POA: Diagnosis not present

## 2022-10-28 NOTE — Telephone Encounter (Signed)
Called pt to get u/s rescheduled and pt stated Dr. March Rummage told her to wait but did not mention how long. Pt is unsure of when to get u/s and wants to wait for Dr. Mervyn Skeeters to return from vacation for guidance.

## 2022-10-29 ENCOUNTER — Ambulatory Visit: Payer: BC Managed Care – PPO

## 2022-10-29 LAB — SURGICAL PATHOLOGY

## 2022-10-30 ENCOUNTER — Encounter: Payer: Self-pay | Admitting: Obstetrics and Gynecology

## 2022-11-04 DIAGNOSIS — F4320 Adjustment disorder, unspecified: Secondary | ICD-10-CM | POA: Diagnosis not present

## 2022-11-11 ENCOUNTER — Encounter: Payer: BC Managed Care – PPO | Admitting: Obstetrics & Gynecology

## 2022-11-21 DIAGNOSIS — F4320 Adjustment disorder, unspecified: Secondary | ICD-10-CM | POA: Diagnosis not present

## 2022-11-24 ENCOUNTER — Ambulatory Visit: Payer: BC Managed Care – PPO | Admitting: Obstetrics & Gynecology

## 2022-11-24 ENCOUNTER — Encounter: Payer: Self-pay | Admitting: Obstetrics & Gynecology

## 2022-11-24 VITALS — BP 127/85 | HR 88 | Wt 277.0 lb

## 2022-11-24 DIAGNOSIS — Z9889 Other specified postprocedural states: Secondary | ICD-10-CM | POA: Diagnosis not present

## 2022-11-24 DIAGNOSIS — N939 Abnormal uterine and vaginal bleeding, unspecified: Secondary | ICD-10-CM | POA: Diagnosis not present

## 2022-11-24 NOTE — Progress Notes (Signed)
   GYNECOLOGY OFFICE VISIT NOTE  History:   Sherry [name] is a 37 y.o. 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  She was having some abnormal bleeding prior to LEEP, ameliorated after taking Lysteda. She did not take prescribed Doxycycline regimen as this made her nauseated.  No bleeding currently.  She denies any abnormal vaginal discharge, bleeding, pelvic pain or other concerns.    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   Review of Systems:  Pertinent items noted in HPI and remainder of comprehensive ROS otherwise negative.  Physical Exam:  BP 127/85   Pulse 88   Wt 277 lb (125.6 kg)   BMI 39.75 kg/m  CONSTITUTIONAL: Well-developed, well-nourished female in no acute distress.  HEENT:  Normocephalic, atraumatic. External right and left ear normal. No scleral icterus.  NECK: Normal range of motion, supple, no masses noted on observation SKIN: No rash noted. Not diaphoretic. No erythema. No pallor. MUSCULOSKELETAL: Normal range of motion. No edema noted. NEUROLOGIC: Alert and oriented to person, place, and time. Normal muscle tone coordination. No cranial nerve deficit noted. PSYCHIATRIC: Normal mood and affect. Normal behavior. Normal judgment and thought content. CARDIOVASCULAR: Normal heart rate noted RESPIRATORY: Effort and breath sounds normal, no problems with respiration noted ABDOMEN: No masses noted. No other overt distention noted.   PELVIC: Normal appearing external  genitalia; normal urethral meatus; LEEP site well healed.  No abnormal discharge noted.  Performed in the presence of a chaperone  10/27/2022 Pathology A. ENDOMETRIUM, BIOPSY:  -  Proliferative phase endometrium, negative for atypia/hyperplasia.   B. 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  Margins negative (focally <1 mm from endocervical margin).      Assessment and Plan:     1. S/P LEEP Pathology reviewed, reassuring and negative margins.  Will repeat pap around February 2025.   2. Abnormal uterine bleeding Resolved for now.  Benign endometrial biopsy reviewed with patient.  If recurs, will reschedule ultrasound and may need further evaluation.  Precautions reviewed.  Routine preventative health maintenance measures emphasized. Please refer to After Visit Summary for other counseling recommendations.   Return for any gynecologic concerns.    I spent 30 minutes dedicated to the care of this patient including pre-visit review of records, face to face time with the patient discussing her conditions and treatments and post visit orders.    Jaynie Collins, MD, FACOG Obstetrician & Gynecologist, [facility] for [employer], [facility]

## 2022-12-24 DIAGNOSIS — M545 Low back pain, unspecified: Secondary | ICD-10-CM | POA: Diagnosis not present

## 2023-01-01 ENCOUNTER — Encounter: Payer: Self-pay | Admitting: Obstetrics & Gynecology

## 2023-01-01 ENCOUNTER — Other Ambulatory Visit: Payer: Self-pay | Admitting: *Deleted

## 2023-01-01 DIAGNOSIS — F4320 Adjustment disorder, unspecified: Secondary | ICD-10-CM | POA: Diagnosis not present

## 2023-01-01 MED ORDER — VALACYCLOVIR HCL 1 G PO TABS
1000.0000 mg | ORAL_TABLET | Freq: Every day | ORAL | 2 refills | Status: AC
Start: 2023-01-01 — End: ?

## 2023-01-12 ENCOUNTER — Other Ambulatory Visit: Payer: Self-pay | Admitting: *Deleted

## 2023-01-12 MED ORDER — VALACYCLOVIR HCL 500 MG PO TABS
500.0000 mg | ORAL_TABLET | Freq: Every day | ORAL | 12 refills | Status: AC
Start: 2023-01-12 — End: ?

## 2023-02-19 DIAGNOSIS — F4320 Adjustment disorder, unspecified: Secondary | ICD-10-CM | POA: Diagnosis not present

## 2023-03-11 DIAGNOSIS — F4320 Adjustment disorder, unspecified: Secondary | ICD-10-CM | POA: Diagnosis not present

## 2023-03-24 DIAGNOSIS — F4320 Adjustment disorder, unspecified: Secondary | ICD-10-CM | POA: Diagnosis not present

## 2023-04-03 ENCOUNTER — Inpatient Hospital Stay (HOSPITAL_COMMUNITY)
Admission: AD | Admit: 2023-04-03 | Discharge: 2023-04-04 | Disposition: A | Discharge status: Home / Self Care | Attending: Obstetrics & Gynecology | Admitting: Obstetrics & Gynecology

## 2023-04-03 ENCOUNTER — Inpatient Hospital Stay (HOSPITAL_COMMUNITY)

## 2023-04-03 ENCOUNTER — Encounter (HOSPITAL_COMMUNITY): Payer: Self-pay | Admitting: *Deleted

## 2023-04-03 DIAGNOSIS — O26891 Other specified pregnancy related conditions, first trimester: Secondary | ICD-10-CM

## 2023-04-03 DIAGNOSIS — R102 Pelvic and perineal pain: Secondary | ICD-10-CM | POA: Diagnosis not present

## 2023-04-03 DIAGNOSIS — O23591 Infection of other part of genital tract in pregnancy, first trimester: Secondary | ICD-10-CM | POA: Diagnosis not present

## 2023-04-03 DIAGNOSIS — B9689 Other specified bacterial agents as the cause of diseases classified elsewhere: Secondary | ICD-10-CM | POA: Insufficient documentation

## 2023-04-03 DIAGNOSIS — Z3A01 Less than 8 weeks gestation of pregnancy: Secondary | ICD-10-CM | POA: Insufficient documentation

## 2023-04-03 DIAGNOSIS — R109 Unspecified abdominal pain: Secondary | ICD-10-CM | POA: Diagnosis not present

## 2023-04-03 DIAGNOSIS — O09511 Supervision of elderly primigravida, first trimester: Secondary | ICD-10-CM | POA: Diagnosis not present

## 2023-04-03 DIAGNOSIS — Z3491 Encounter for supervision of normal pregnancy, unspecified, first trimester: Secondary | ICD-10-CM

## 2023-04-03 LAB — URINALYSIS, ROUTINE W REFLEX MICROSCOPIC
Bilirubin Urine: NEGATIVE
Glucose, UA: NEGATIVE mg/dL
Hgb urine dipstick: NEGATIVE
Ketones, ur: NEGATIVE mg/dL
Leukocytes,Ua: NEGATIVE
Nitrite: NEGATIVE
Protein, ur: NEGATIVE mg/dL
Specific Gravity, Urine: 1.02 (ref 1.005–1.030)
pH: 6 (ref 5.0–8.0)

## 2023-04-03 LAB — CBC
HCT: 36.4 % (ref 36.0–46.0)
Hemoglobin: 11.8 g/dL — ABNORMAL LOW (ref 12.0–15.0)
MCH: 25.3 pg — ABNORMAL LOW (ref 26.0–34.0)
MCHC: 32.4 g/dL (ref 30.0–36.0)
MCV: 77.9 fL — ABNORMAL LOW (ref 80.0–100.0)
Platelets: 457 10*3/uL — ABNORMAL HIGH (ref 150–400)
RBC: 4.67 MIL/uL (ref 3.87–5.11)
RDW: 14.6 % (ref 11.5–15.5)
WBC: 11.2 10*3/uL — ABNORMAL HIGH (ref 4.0–10.5)
nRBC: 0 % (ref 0.0–0.2)

## 2023-04-03 LAB — COMPREHENSIVE METABOLIC PANEL
ALT: 11 U/L (ref 0–44)
AST: 14 U/L — ABNORMAL LOW (ref 15–41)
Albumin: 3 g/dL — ABNORMAL LOW (ref 3.5–5.0)
Alkaline Phosphatase: 84 U/L (ref 38–126)
Anion gap: 8 (ref 5–15)
BUN: 6 mg/dL (ref 6–20)
CO2: 24 mmol/L (ref 22–32)
Calcium: 8.9 mg/dL (ref 8.9–10.3)
Chloride: 103 mmol/L (ref 98–111)
Creatinine, Ser: 0.83 mg/dL (ref 0.44–1.00)
GFR, Estimated: >60 mL/min (ref >60)
Glucose, Bld: 110 mg/dL — ABNORMAL HIGH (ref 70–99)
Potassium: 3.6 mmol/L (ref 3.5–5.1)
Sodium: 135 mmol/L (ref 135–145)
Total Bilirubin: 0.5 mg/dL (ref 0.0–1.2)
Total Protein: 6.8 g/dL (ref 6.5–8.1)

## 2023-04-03 LAB — POCT PREGNANCY, URINE: Preg Test, Ur: POSITIVE — AB

## 2023-04-03 NOTE — MAU Note (Signed)
.  Sherry [name] is a 38 y.o. at [redacted]w[redacted]d here in MAU reporting abdominal cramping like a period for several days. She thought her period was coming but it never did. Pt did home upt yesterday and it was positive. Denies VB. No pain currently. Pain comes and goes  LMP: 02/08/23 but unsure of exact date Onset of complaint: 2 days Pain score: 0 Vitals:   04/03/23 2234 04/03/23 2238  BP:  136/71  Pulse: 91   Resp: 18   Temp: 98.9 F (37.2 C)   SpO2: 100%      FHT: n/a  Lab orders placed from triage: u/a

## 2023-04-04 ENCOUNTER — Other Ambulatory Visit: Payer: Self-pay

## 2023-04-04 DIAGNOSIS — O09511 Supervision of elderly primigravida, first trimester: Secondary | ICD-10-CM | POA: Diagnosis not present

## 2023-04-04 DIAGNOSIS — Z8759 Personal history of other complications of pregnancy, childbirth and the puerperium: Secondary | ICD-10-CM

## 2023-04-04 DIAGNOSIS — Z3A01 Less than 8 weeks gestation of pregnancy: Secondary | ICD-10-CM | POA: Diagnosis not present

## 2023-04-04 DIAGNOSIS — R102 Pelvic and perineal pain: Secondary | ICD-10-CM | POA: Diagnosis not present

## 2023-04-04 LAB — WET PREP, GENITAL
Sperm: NONE SEEN
Trich, Wet Prep: NONE SEEN
WBC, Wet Prep HPF POC: <10 (ref <10)
Yeast Wet Prep HPF POC: NONE SEEN

## 2023-04-04 LAB — HCG, QUANTITATIVE, PREGNANCY: hCG, Beta Chain, Quant, S: 23440 m[IU]/mL — ABNORMAL HIGH (ref <5)

## 2023-04-04 MED ORDER — PRENATAL 28-0.8 MG PO TABS: 1.0000 | ORAL_TABLET | Freq: Every day | ORAL | 12 refills | Status: DC

## 2023-04-04 MED ORDER — METRONIDAZOLE 500 MG PO TABS: 500.0000 mg | ORAL_TABLET | Freq: Two times a day (BID) | ORAL | 0 refills | Status: AC

## 2023-04-04 NOTE — MAU Provider Note (Signed)
 History     CSN: [id number]  Arrival date and time: 04/03/23 2202   Event Date/Time   First Provider Initiated Contact with Patient 04/04/23 0031      Chief Complaint  Patient presents with   Abdominal Pain    Sherry [name] is a 38 y.o. [id number] at [redacted]w[redacted]d who receives care at [facility].  She presents today for lower abdominal cramping. She states she has had the cramping for a few days and contributed it to premenstrual cramps. However, she states she took a home UPT yesterday and it was positive.  She states the pain is intermittent and she has none currently, but is concerned.  Patient denies vaginal concerns or issues with urination.    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    Headache    Type 2 HSV infection of vulvovaginal region     Past Surgical History:  Procedure Laterality Date   DILATION AND CURETTAGE, DIAGNOSTIC / THERAPEUTIC      Family History  Problem Relation Age of Onset   Diabetes Mother    Cancer Maternal Uncle    Colon cancer Maternal Uncle    Cancer Maternal Grandmother    Kidney cancer Maternal Grandmother     Social History   Tobacco Use   Smoking status: Never    Passive exposure: Never   Smokeless tobacco: Never  Vaping Use   Vaping status: Never Used  Substance Use Topics   Alcohol use: Yes    Alcohol/week: 2.0 standard drinks of alcohol    Types: 2 Shots of liquor per week    Comment: social    Drug use: Never    Allergies: No Known Allergies  Medications Prior to Admission  Medication Sig Dispense Refill Last Dose/Taking   valACYclovir (VALTREX) 500 MG tablet Take 1 tablet (500 mg total) by mouth daily. Can increase to twice a day for 5 days in the event of a recurrence 30 tablet  12    valACYclovir (VALTREX) 1000 MG tablet Take 1 tablet (1,000 mg total) by mouth daily. Take for 5 days 5 tablet 2     Review of Systems  Gastrointestinal:  Positive for abdominal pain (Cramping).  Genitourinary:  Negative for difficulty urinating, dysuria, vaginal bleeding and vaginal discharge.   Physical Exam   Blood pressure 136/71, pulse 91, temperature 98.9 F (37.2 C), resp. rate 18, height 5\' 10"  (1.778 m), weight 132.9 kg, last menstrual period 02/11/2023, SpO2 100%, not currently breastfeeding.  Physical Exam Vitals and nursing note reviewed.  Constitutional:      General: She is not in acute distress.    Appearance: She is well-developed.  HENT:     Head: Normocephalic and atraumatic.  Eyes:     Conjunctiva/sclera: Conjunctivae normal.  Cardiovascular:     Rate and Rhythm: Normal rate.  Pulmonary:     Effort: No respiratory distress.  Musculoskeletal:        General: Normal range of motion.  Neurological:     Mental Status: She is alert and oriented to person, place, and time.  Psychiatric:        Mood and Affect: Mood normal.        Behavior: Behavior normal.     MAU Course  Procedures Results for orders placed or performed during the hospital encounter of 04/03/23 (from the past 24 hours)  Pregnancy, urine POC     Status: Abnormal   Collection Time: 04/03/23 10:27 PM  Result Value Ref Range   Preg Test, Ur POSITIVE (A) NEGATIVE  Urinalysis, Routine w reflex microscopic -Urine, Clean Catch     Status: None   Collection Time: 04/03/23 10:42 PM  Result Value Ref Range   Color, Urine YELLOW YELLOW   APPearance CLEAR CLEAR   Specific Gravity, Urine 1.020 1.005 - 1.030   pH 6.0 5.0 - 8.0   Glucose, UA NEGATIVE NEGATIVE mg/dL   Hgb urine dipstick NEGATIVE NEGATIVE   Bilirubin Urine NEGATIVE NEGATIVE   Ketones, ur NEGATIVE NEGATIVE mg/dL   Protein, ur NEGATIVE NEGATIVE mg/dL   Nitrite NEGATIVE NEGATIVE   Leukocytes,Ua NEGATIVE NEGATIVE  CBC     Status:  Abnormal   Collection Time: 04/03/23 11:10 PM  Result Value Ref Range   WBC 11.2 (H) 4.0 - 10.5 K/uL   RBC 4.67 3.87 - 5.11 MIL/uL   Hemoglobin 11.8 (L) 12.0 - 15.0 g/dL   HCT 27.2 53.6 - 64.4 %   MCV 77.9 (L) 80.0 - 100.0 fL   MCH 25.3 (L) 26.0 - 34.0 pg   MCHC 32.4 30.0 - 36.0 g/dL   RDW 03.4 74.2 - 59.5 %   Platelets 457 (H) 150 - 400 K/uL   nRBC 0.0 0.0 - 0.2 %  hCG, quantitative, pregnancy     Status: Abnormal   Collection Time: 04/03/23 11:10 PM  Result Value Ref Range   hCG, Beta Chain, Quant, S 23,440 (H) <5 mIU/mL  Comprehensive metabolic panel     Status: Abnormal   Collection Time: 04/03/23 11:10 PM  Result Value Ref Range   Sodium 135 135 - 145 mmol/L   Potassium 3.6 3.5 - 5.1 mmol/L   Chloride 103 98 - 111 mmol/L   CO2 24 22 - 32 mmol/L   Glucose, Bld 110 (H) 70 - 99 mg/dL   BUN 6 6 - 20 mg/dL   Creatinine, Ser 6.38 0.44 - 1.00 mg/dL   Calcium 8.9 8.9 - 75.6 mg/dL   Total Protein 6.8 6.5 - 8.1 g/dL   Albumin 3.0 (L) 3.5 - 5.0 g/dL   AST 14 (L) 15 - 41 U/L   ALT 11 0 - 44 U/L   Alkaline Phosphatase 84 38 - 126 U/L   Total Bilirubin 0.5 0.0 - 1.2 mg/dL   GFR, Estimated >43 >32 mL/min   Anion gap 8 5 - 15  Wet prep, genital     Status: Abnormal   Collection Time: 04/03/23 11:50 PM  Result Value Ref Range   Yeast Wet Prep HPF POC NONE SEEN NONE SEEN   Trich, Wet Prep NONE SEEN NONE SEEN   Clue Cells Wet Prep HPF POC PRESENT (A) NONE SEEN   WBC, Wet Prep HPF POC <10 <10   Sperm NONE SEEN    US OB LESS THAN 14 WEEKS WITH OB TRANSVAGINAL Result Date: 04/04/2023 CLINICAL DATA:  Known positive pregnancy test with pelvic pain for several days, initial encounter EXAM: OBSTETRIC <14 WK [place] AND TRANSVAGINAL OB US TECHNIQUE: Both transabdominal  and transvaginal ultrasound examinations were performed for complete evaluation of the gestation as well as the maternal uterus, adnexal regions, and pelvic cul-de-sac. Transvaginal technique was performed to assess early pregnancy.  COMPARISON:  None Available. FINDINGS: Intrauterine gestational sac: Present Yolk sac:  Present Embryo:  Present Cardiac Activity: Present Heart Rate: 98 bpm CRL:  3.1 mm   5 w   6 d                  [place] EDC: 11/28/2023 Subchorionic hemorrhage:  None visualized. Maternal uterus/adnexae: Corpus luteum cyst is noted on the right. IMPRESSION: Single live intrauterine gestation at 5 weeks 6 days. Electronically Signed   By: Alcide Clever M.D.   On: 04/04/2023 00:14    MDM Physical Exam Cultures: Wet Prep and GC/CT Labs: UA, UPT, CBC, CMP, hCG, ABO Ultrasound Prescriptions Coordination of Follow Up Assessment and Plan  38 year old, [id number]  SIUP at 5.6 weeks Abdominal Cramping Bacterial Vaginosis  -Orders placed while patient in triage. -Results as above and reviewed with patient.  -Reviewed POC with patient. -Informed that findings are c/w LMP. -Discussed low FHR and recommendation for repeat US in 7-14 days.  -Order placed and message sent to Janie Morning, RN at [facility] for scheduling accordingly. -Patient informed of BV and has had in past.   -Rx also sent for metronidazole and prenatal vitamins sent by Dr. Marquis Lunch. Anyanwu. -Precautions reviewed. -Encouraged to call primary office or return to MAU if symptoms worsen or with the onset of new symptoms. -Discharged to home in stable condition.  Cherre Robins MSN, CNM Advanced Practice Provider, Center for [facility] 04/04/2023, 12:31 AM

## 2023-04-04 NOTE — Discharge Instructions (Signed)
   [facility] [employer] for [employer] at [facility]  [address], [place], [place] [address]  [phone]  Center for [facility] at [facility]  [address], [place], [place] [address]  [phone]  Center for [facility] Healthcare at [facility] [address], [place], [place] [address]  [phone]  Center for [facility] at [facility] [address], [place], [place] [address] [phone]  Center for [facility] at [facility]  [address] [place], [place] [address]  [phone]  Center for [facility] at [facility]  [address] (First floor), [place], [place] [address]  [phone]  Center for [facility] at [facility]  [address] [place], [place], [place] [address]  [phone]  [facility]  [address], [place], [place] [address]  [phone]  [facility]  [address] [place], [place], [place] [address]  [phone]  [facility]  [address] [place], [place] [address]  [phone]  [facility]  [address] [place], [place] [address]  [phone]  [facility]  [address], [place], [place] [address]  [phone]  [facility]   [address] [place], [place] [address]  [phone]  [facility]  [address] [place], [place] [address]   [phone]  [facility] [address], [place], [place] [address] [phone]  [facility]  [address] [place], [place] [address]  [phone]

## 2023-04-06 LAB — GC/CHLAMYDIA PROBE AMP (~~LOC~~) NOT AT ARMC
Chlamydia: NEGATIVE
Comment: NEGATIVE
Comment: NORMAL
Neisseria Gonorrhea: NEGATIVE

## 2023-04-15 ENCOUNTER — Encounter

## 2023-04-23 DIAGNOSIS — Z3201 Encounter for pregnancy test, result positive: Secondary | ICD-10-CM | POA: Diagnosis not present

## 2023-04-23 DIAGNOSIS — Z130 Encounter for screening for diseases of the blood and blood-forming organs and certain disorders involving the immune mechanism: Secondary | ICD-10-CM | POA: Diagnosis not present

## 2023-05-06 ENCOUNTER — Ambulatory Visit: Admitting: Certified Nurse Midwife

## 2023-05-06 DIAGNOSIS — F4320 Adjustment disorder, unspecified: Secondary | ICD-10-CM | POA: Diagnosis not present

## 2023-05-17 ENCOUNTER — Telehealth

## 2023-05-17 ENCOUNTER — Telehealth: Admitting: Family Medicine

## 2023-05-17 DIAGNOSIS — S39012A Strain of muscle, fascia and tendon of lower back, initial encounter: Secondary | ICD-10-CM | POA: Diagnosis not present

## 2023-05-17 MED ORDER — NAPROXEN 500 MG PO TABS: 500.0000 mg | ORAL_TABLET | Freq: Two times a day (BID) | ORAL | 0 refills | Status: DC

## 2023-05-17 MED ORDER — CYCLOBENZAPRINE HCL 10 MG PO TABS: 10.0000 mg | ORAL_TABLET | Freq: Three times a day (TID) | ORAL | 0 refills | Status: DC | PRN

## 2023-05-17 NOTE — Progress Notes (Signed)
 Virtual Visit Consent   Sherry [name], you are scheduled for a virtual visit with a [facility] provider today. Just as with appointments in the office, your consent must be obtained to participate. Your consent will be active for this visit and any virtual visit you may have with one of our providers in the next 365 days. If you have a MyChart account, a copy of this consent can be sent to you electronically.  As this is a virtual visit, video technology does not allow for your provider to perform a traditional examination. This may limit your provider's ability to fully assess your condition. If your provider identifies any concerns that need to be evaluated in person or the need to arrange testing (such as labs, EKG, etc.), we will make arrangements to do so. Although advances in technology are sophisticated, we cannot ensure that it will always work on either your end or our end. If the connection with a video visit is poor, the visit may have to be switched to a telephone visit. With either a video or telephone visit, we are not always able to ensure that we have a secure connection.  By engaging in this virtual visit, you consent to the provision of healthcare and authorize for your insurance to be billed (if applicable) for the services provided during this visit. Depending on your insurance coverage, you may receive a charge related to this service.  I need to obtain your verbal consent now. Are you willing to proceed with your visit today? Sherry [name] has provided verbal consent on 05/17/2023 for a virtual visit (video or telephone). Albertha Huger, FNP  Date: 05/17/2023 1:04 PM   Virtual Visit via Video Note   I, Albertha Huger, connected with  Sherry [name]  ([id number], 07/26/1985) on 05/17/23 at  1:00 PM EDT by a video-enabled telemedicine application and verified that I am speaking with the correct person using two identifiers.  Location: Patient: Virtual Visit Location Patient:  Home Provider: Virtual Visit Location Provider: Home Office   I discussed the limitations of evaluation and management by telemedicine and the availability of in person appointments. The patient expressed understanding and agreed to proceed.    History of Present Illness: Sherry [name] is a 38 y.o. who identifies as a female who was assigned female at birth, and is being seen today for left low back pain after twisting a few days ago while putting her daughter in her car seat. She is not pregnant at this time. She had an abortion. No fever. No radiculopathy. No urinary sx. 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 Can increase to twice a day for 5 days in the event of a recurrence, Disp: 30 tablet, Rfl: 12   Prenatal 28-0.8 MG TABS, Take 1 tablet by mouth daily., Disp: 30 tablet, Rfl: 12   valACYclovir  (VALTREX ) 1000 MG tablet, Take 1 tablet (1,000 mg total) by mouth daily. Take for 5 days, Disp: 5 tablet, Rfl: 2  Observations/Objective: Patient is well-developed, well-nourished in no acute distress.  Resting comfortably  at home.  Head is normocephalic, atraumatic.  No labored breathing.  Speech is clear and coherent with logical content.  Patient is alert and oriented at baseline.    Assessment and Plan: 1. Strain of lumbar region, initial encounter (Primary)  Heat, med use and side effects discussed, UC if sx worsen.   Follow Up Instructions: I discussed the assessment and treatment plan with the patient. The patient was provided an opportunity to ask questions and all were answered. The patient agreed with  the plan and demonstrated an understanding of the instructions.  A copy of instructions were sent to the patient via MyChart unless otherwise noted below.     The patient was advised to call back or seek an in-person evaluation if the symptoms worsen or if the condition fails to improve as anticipated.    Terri Rorrer, FNP

## 2023-05-17 NOTE — Patient Instructions (Signed)
Lumbar Strain A lumbar strain, which is sometimes called a low-back strain, is a stretch or tear in a muscle or tendons in the lower back (lumbar spine). Tendons are the strong cords of tissue that attach muscle to bone. This type of injury occurs when muscles or tendons are torn or are stretched beyond their limits. Lumbar strains can range from mild to severe. Mild strains may involve stretching a muscle or tendon without tearing it. These may heal in 1-2 weeks. More severe strains involve tearing of muscle fibers or tendons. These will cause more pain and may take 6-8 weeks to heal. What are the causes? This condition may be caused by: Trauma, such as a fall or a hit to the body. Twisting or overstretching the back. This may result from doing activities that take a lot of energy, such as lifting heavy objects. What increases the risk? A lumbar strain is more common in: Athletes. People with obesity. People who do repeated lifting, bending, or other movements that involve their back. What are the signs or symptoms? Symptoms of this condition may include: Sharp or dull pain in the lower back that does not go away. The pain may spread to the buttocks. Stiffness or limited range of motion. Sudden muscle tightening (spasms). How is this diagnosed? This condition may be diagnosed based on: Your symptoms. Your medical history. A physical exam. Imaging tests, such as: X-rays. MRI. How is this treated? Treatment for this condition may include: Rest. Applying heat and cold to the affected area. Over-the-counter medicines to help with pain and inflammation, such as NSAIDs. Prescription medicine for pain or to relax the muscles. These may be needed for a short time. Physical therapy exercises to improve movement and strength. Follow these instructions at home: Managing pain, stiffness, and swelling     If told, put ice on the injured area during the first 24 hours after your injury. Put  ice in a plastic bag. Place a towel between your skin and the bag. Leave the ice on for 20 minutes, 2-3 times a day. If told, apply heat to the affected area as often as told by your health care provider. Use the heat source that your health care provider recommends, such as a moist heat pack or a heating pad. Place a towel between your skin and the heat source. Leave the heat on for 20-30 minutes. Remove the heat if your skin turns bright red. This is especially important if you are unable to feel pain, heat, or cold. You have a greater risk of getting burned. If your skin turns bright red, remove the ice or heat right away to prevent skin damage. The risk of damage is higher if you cannot feel pain, heat, or cold. Activity Rest and return to your normal activities as told by your health care provider. Ask your health care provider what activities are safe for you. Do exercises as told by your health care provider. General instructions Take over-the-counter and prescription medicines only as told by your health care provider. Ask your health care provider if the medicine prescribed to you: Requires you to avoid driving or using machinery. Can cause constipation. You may need to take these actions to prevent or treat constipation: Drink enough fluid to keep your urine pale yellow. Take over-the-counter or prescription medicines. Eat foods that are high in fiber, such as beans, whole grains, and fresh fruits and vegetables. Limit foods that are high in fat and processed sugars, such as fried or  sweet foods. Do not use any products that contain nicotine or tobacco. These products include cigarettes, chewing tobacco, and vaping devices, such as e-cigarettes. If you need help quitting, ask your health care provider. How is this prevented? To prevent a future low-back injury: Always warm up properly before physical activity or sports. Cool down and stretch after being active. Use correct form  when playing sports and lifting heavy objects. Bend your knees before you lift heavy objects. Use good posture when sitting and standing. Stay physically fit and keep a healthy weight. Do at least 150 minutes of moderate intensity exercise each week, such as brisk walking or water aerobics. Do strength exercises at least 2 times each week. Contact a health care provider if: Your back pain does not improve after several weeks of treatment. Your symptoms get worse. You have a fever. Get help right away if: Your back pain is severe. You are unable to stand or walk. You develop pain in your legs. You have weakness in your buttocks or legs. You have trouble controlling when you urinate or when you have a bowel movement. You have frequent, painful, or bloody urination. This information is not intended to replace advice given to you by your health care provider. Make sure you discuss any questions you have with your health care provider. Document Revised: 05/05/2022 Document Reviewed: 07/23/2021 Elsevier Patient Education  2024 [employer].

## 2023-05-20 DIAGNOSIS — F4320 Adjustment disorder, unspecified: Secondary | ICD-10-CM | POA: Diagnosis not present

## 2023-05-29 ENCOUNTER — Telehealth: Admitting: Family Medicine

## 2023-05-29 DIAGNOSIS — J069 Acute upper respiratory infection, unspecified: Secondary | ICD-10-CM

## 2023-05-29 MED ORDER — FLUTICASONE PROPIONATE 50 MCG/ACT NA SUSP: 2.0000 | Freq: Every day | NASAL | 6 refills | Status: DC

## 2023-05-29 MED ORDER — PREDNISONE 20 MG PO TABS: 20.0000 mg | ORAL_TABLET | Freq: Two times a day (BID) | ORAL | 0 refills | Status: AC

## 2023-05-29 NOTE — Progress Notes (Signed)
 Virtual Visit Consent   Sherry [name], you are scheduled for a virtual visit with a [facility] provider today. Just as with appointments in the office, your consent must be obtained to participate. Your consent will be active for this visit and any virtual visit you may have with one of our providers in the next 365 days. If you have a MyChart account, a copy of this consent can be sent to you electronically.  As this is a virtual visit, video technology does not allow for your provider to perform a traditional examination. This may limit your provider's ability to fully assess your condition. If your provider identifies any concerns that need to be evaluated in person or the need to arrange testing (such as labs, EKG, etc.), we will make arrangements to do so. Although advances in technology are sophisticated, we cannot ensure that it will always work on either your end or our end. If the connection with a video visit is poor, the visit may have to be switched to a telephone visit. With either a video or telephone visit, we are not always able to ensure that we have a secure connection.  By engaging in this virtual visit, you consent to the provision of healthcare and authorize for your insurance to be billed (if applicable) for the services provided during this visit. Depending on your insurance coverage, you may receive a charge related to this service.  I need to obtain your verbal consent now. Are you willing to proceed with your visit today? Sherry [name] has provided verbal consent on 05/29/2023 for a virtual visit (video or telephone). Albertha Huger, FNP  Date: 05/29/2023 11:04 AM   Virtual Visit via Video Note   I, Albertha Huger, connected with  Sherry [name]  ([id number], 03/21/1985) on 05/29/23 at 11:00 AM EDT by a video-enabled telemedicine application and verified that I am speaking with the correct person using two identifiers.  Location: Patient: Virtual Visit Location Patient:  Home Provider: Virtual Visit Location Provider: Home Office   I discussed the limitations of evaluation and management by telemedicine and the availability of in person appointments. The patient expressed understanding and agreed to proceed.    History of Present Illness: Sherry [name] is a 38 y.o. who identifies as a female who was assigned female at birth, and is being seen today for head congestion, drainage and sore throat, with no fever ot cough. Sx for 2 days. 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 Can increase to twice a day for 5 days in the event of a recurrence, Disp: 30 tablet, Rfl: 12   cyclobenzaprine  (FLEXERIL ) 10 MG tablet, Take 1 tablet (10 mg total) by mouth 3 (three) times daily as needed for muscle spasms., Disp: 30 tablet, Rfl: 0   naproxen  (NAPROSYN ) 500 MG tablet,  Take 1 tablet (500 mg total) by mouth 2 (two) times daily with a meal., Disp: 30 tablet, Rfl: 0   Prenatal 28-0.8 MG TABS, Take 1 tablet by mouth daily., Disp: 30 tablet, Rfl: 12   valACYclovir  (VALTREX ) 1000 MG tablet, Take 1 tablet (1,000 mg total) by mouth daily. Take for 5 days, Disp: 5 tablet, Rfl: 2  Observations/Objective: Patient is well-developed, well-nourished in no acute distress.  Resting comfortably  at home.  Head is normocephalic, atraumatic.  No labored breathing.   Speech is clear and coherent with logical content.  Patient is alert and oriented at baseline.    Assessment and Plan: 1. Viral URI (Primary)  Increase fluids, humidifier at night, UC as needed. May take otc allergy meds.   Follow Up Instructions: I discussed the assessment and treatment plan with the patient. The patient was provided an opportunity to ask questions and all were answered. The patient agreed with the plan and demonstrated an understanding of the instructions.  A copy of instructions were sent to the patient via MyChart unless otherwise noted below.     The patient was advised to call back or seek an in-person evaluation if the symptoms worsen or if the condition fails to improve as anticipated.    Shereda Graw, FNP

## 2023-05-29 NOTE — Patient Instructions (Signed)

## 2023-06-30 DIAGNOSIS — F4320 Adjustment disorder, unspecified: Secondary | ICD-10-CM | POA: Diagnosis not present

## 2023-07-24 ENCOUNTER — Encounter: Payer: Self-pay | Admitting: Family Medicine

## 2023-07-27 ENCOUNTER — Ambulatory Visit: Admitting: Family Medicine

## 2023-07-27 NOTE — Progress Notes (Deleted)
      Established patient visit   Patient: Sherry [name]   DOB: 06-16-1985   38 y.o. Female  MRN: [id number] Visit Date: 07/27/2023  Today's healthcare provider: Rockie Agent, MD   No chief complaint on file.  Subjective       Discussed the use of AI scribe software for clinical note transcription with the patient, who gave verbal consent to proceed.  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 Can increase to twice a day for 5 days in the event of a recurrence   cyclobenzaprine  (FLEXERIL ) 10 MG tablet Take 1 tablet (10 mg total) by mouth 3 (three) times daily as needed for muscle spasms.   fluticasone  (FLONASE ) 50 MCG/ACT nasal spray Place 2 sprays into both nostrils daily.   naproxen  (NAPROSYN ) 500 MG tablet Take 1 tablet (500 mg total) by mouth 2 (two) times daily with a meal.   Prenatal 28-0.8 MG TABS Take 1 tablet by mouth daily.   valACYclovir  (VALTREX ) 1000 MG tablet Take 1 tablet (1,000 mg total) by mouth daily. Take for 5 days   No facility-administered medications prior to visit.    Review of Systems  {Insert previous labs (optional):23779} {See past labs  Heme  Chem  Endocrine  Serology  Results Review (optional):1}   Objective    LMP 02/11/2023  {Insert last BP/Wt (optional):23777}{See vitals history (optional):1}    Physical Exam  ***  No results found for any visits on 07/27/23.  Assessment & Plan     Problem List Items Addressed This Visit   None   Assessment and Plan Assessment & Plan      No follow-ups on file.         Rockie Agent, MD  [facility] [phone] (phone) [phone] (fax)  [facility]

## 2023-07-28 DIAGNOSIS — F4320 Adjustment disorder, unspecified: Secondary | ICD-10-CM | POA: Diagnosis not present

## 2023-08-05 ENCOUNTER — Encounter: Payer: Self-pay | Admitting: Family Medicine

## 2023-08-05 ENCOUNTER — Ambulatory Visit (INDEPENDENT_AMBULATORY_CARE_PROVIDER_SITE_OTHER): Admitting: Family Medicine

## 2023-08-05 VITALS — BP 125/78 | HR 85 | Temp 98.4°F | Ht 70.0 in | Wt 303.4 lb

## 2023-08-05 DIAGNOSIS — M25562 Pain in left knee: Secondary | ICD-10-CM

## 2023-08-05 DIAGNOSIS — M25561 Pain in right knee: Secondary | ICD-10-CM

## 2023-08-05 DIAGNOSIS — G8929 Other chronic pain: Secondary | ICD-10-CM | POA: Diagnosis not present

## 2023-08-05 MED ORDER — MELOXICAM 15 MG PO TABS: 15.0000 mg | ORAL_TABLET | Freq: Every day | ORAL | 0 refills | Status: AC
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
## 2023-08-05 NOTE — Patient Instructions (Addendum)
 Please report to [facility] located at:  [address]  [place], [place] [address]  You do not need an appointment to have xrays completed.   Our office will follow up with  results once available.

## 2023-08-06 ENCOUNTER — Ambulatory Visit: Admitting: Sports Medicine

## 2023-09-28 ENCOUNTER — Ambulatory Visit: Admitting: Family Medicine

## 2023-09-30 DIAGNOSIS — F4320 Adjustment disorder, unspecified: Secondary | ICD-10-CM | POA: Diagnosis not present

## 2023-10-01 DIAGNOSIS — M9903 Segmental and somatic dysfunction of lumbar region: Secondary | ICD-10-CM | POA: Diagnosis not present

## 2023-10-01 DIAGNOSIS — M9904 Segmental and somatic dysfunction of sacral region: Secondary | ICD-10-CM | POA: Diagnosis not present

## 2023-10-01 DIAGNOSIS — M9905 Segmental and somatic dysfunction of pelvic region: Secondary | ICD-10-CM | POA: Diagnosis not present

## 2023-10-01 DIAGNOSIS — M47817 Spondylosis without myelopathy or radiculopathy, lumbosacral region: Secondary | ICD-10-CM | POA: Diagnosis not present

## 2023-10-12 ENCOUNTER — Encounter: Payer: Self-pay | Admitting: Family Medicine

## 2023-11-30 ENCOUNTER — Ambulatory Visit

## 2023-11-30 ENCOUNTER — Encounter: Payer: Self-pay | Admitting: Family Medicine

## 2023-12-01 ENCOUNTER — Encounter: Payer: Self-pay | Admitting: Family Medicine

## 2023-12-01 ENCOUNTER — Ambulatory Visit (INDEPENDENT_AMBULATORY_CARE_PROVIDER_SITE_OTHER): Admitting: Family Medicine

## 2023-12-01 VITALS — BP 126/87 | HR 80 | Temp 97.3°F | Ht 70.0 in | Wt 306.6 lb

## 2023-12-01 DIAGNOSIS — R1031 Right lower quadrant pain: Secondary | ICD-10-CM | POA: Diagnosis not present

## 2023-12-01 DIAGNOSIS — E66813 Obesity, class 3: Secondary | ICD-10-CM

## 2023-12-01 DIAGNOSIS — R7303 Prediabetes: Secondary | ICD-10-CM | POA: Diagnosis not present

## 2023-12-01 DIAGNOSIS — D500 Iron deficiency anemia secondary to blood loss (chronic): Secondary | ICD-10-CM | POA: Diagnosis not present

## 2023-12-01 DIAGNOSIS — Z8742 Personal history of other diseases of the female genital tract: Secondary | ICD-10-CM | POA: Diagnosis not present

## 2023-12-01 NOTE — Patient Instructions (Signed)
 To keep you healthy, please keep in mind the following health maintenance items that you are due for:   Health Maintenance Due  Topic Date Due   Hepatitis B Vaccines 19-59 Average Risk (1 of 3 - 19+ 3-dose series) Never done   Influenza Vaccine  08/14/2023   COVID-19 Vaccine (2 - 2025-26 season) 09/14/2023     Best Wishes,   Dr. Lang

## 2023-12-01 NOTE — Progress Notes (Signed)
 ACUTE VISIT   Patient: Sherry [name]   DOB: 1985/08/20   38 y.o. Female  MRN: [id number]   PCP: Sharma Coyer, MD  Chief Complaint  Patient presents with   Abdominal Pain    Patient reports LRQ abd pain onset x 1 day, not painful today, stopped since last night. Has happened a few times in the past year, happened yesterday while passing bowel movement. Patient reports this was a sharp stabbing pain that was 10/10, doubled over and catching her breath. Some headache, nausea but no vomiting, no fever.    Subjective    HPI HPI     Abdominal Pain    Additional comments: Patient reports LRQ abd pain onset x 1 day, not painful today, stopped since last night. Has happened a few times in the past year, happened yesterday while passing bowel movement. Patient reports this was a sharp stabbing pain that was 10/10, doubled over and catching her breath. Some headache, nausea but no vomiting, no fever.       Last edited by Cherry Chiquita HERO, CMA on 12/01/2023  9:27 AM.       Discussed the use of AI scribe software for clinical note transcription with the patient, who gave verbal consent to proceed.  History of Present Illness Sherry [name] is a 38 year old female who presents with episodes of sharp lower right abdominal pain.  She experiences sharp pain in her lower right abdomen lasting about three to four minutes, severe enough to cause her to hunch over and making movement difficult. The first episode occurred several months ago, initially thought to be a pulled muscle. The pain recurred recently. No changes in stool color or consistency, and bowel movements occur almost daily without difficulty. No current pain, but a sensation of tightness is noted when lifting her daughter.  She has a history of heavy menstrual bleeding, requiring both a tampon and a pad, sometimes bleeding through both. This began after a LEEP procedure. Various forms of birth control, including an IUD  and Nexplanon , have been tried but are not currently used due to discomfort. No vaginal discharge or unusual bleeding outside of her menstrual cycle. The patient recalls having an ovarian cyst during pregnancy, and the doctor confirmed that a previous ultrasound showed a cyst on the left ovary.  She is concerned about her prediabetes status, having not made significant lifestyle changes since her last evaluation a year ago, when her A1c was 6.2%. She consumes four to five cans of Dr. Nunzio daily to manage stress, particularly while working night shifts and caring for her children.  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 She has a two-year-old daughter and a thirteen-year-old child.     Medications: Outpatient Medications Prior to Visit  Medication Sig   meloxicam  (MOBIC ) 15 MG tablet Take 1 tablet (15 mg total) by mouth daily.   valACYclovir  (VALTREX ) 1000 MG tablet Take 1 tablet (1,000 mg total) by mouth daily. Take for 5 days   valACYclovir  (VALTREX ) 500 MG tablet Take 1 tablet (500 mg total) by mouth daily. Can increase to twice a day for 5 days in the event of a recurrence   [DISCONTINUED] fluticasone  (FLONASE ) 50 MCG/ACT nasal spray Place 2 sprays into both nostrils daily.   No facility-administered medications prior to visit.    Last CBC Lab Results  Component Value Date   WBC 11.2 (H) 04/03/2023   HGB  11.8 (L) 04/03/2023   HCT 36.4 04/03/2023   MCV 77.9 (L) 04/03/2023   MCH 25.3 (L) 04/03/2023   RDW 14.6 04/03/2023   PLT 457 (H) 04/03/2023   Last metabolic panel Lab Results  Component Value Date   GLUCOSE 110 (H) 04/03/2023   NA 135 04/03/2023   K 3.6 04/03/2023   CL 103 04/03/2023   CO2 24 04/03/2023   BUN 6 04/03/2023   CREATININE 0.83 04/03/2023   GFRNONAA >60 04/03/2023   CALCIUM 8.9 04/03/2023   PROT 6.8 04/03/2023   ALBUMIN 3.0 (L) 04/03/2023   BILITOT 0.5 04/03/2023   ALKPHOS 84  04/03/2023   AST 14 (L) 04/03/2023   ALT 11 04/03/2023   ANIONGAP 8 04/03/2023   Last lipids No results found for: CHOL, HDL, LDLCALC, LDLDIRECT, TRIG, CHOLHDL Last hemoglobin A1c Lab Results  Component Value Date   HGBA1C 6.2 (H) 08/05/2022        Objective    BP 126/87 (BP Location: Left Arm, Patient Position: Sitting, Cuff Size: Normal)   Pulse 80   Temp (!) 97.3 F (36.3 C) (Oral)   Ht 5' 10 (1.778 m)   Wt (!) 306 lb 9.6 oz (139.1 kg)   LMP 11/15/2023 (Exact Date)   SpO2 98%   BMI 43.99 kg/m  BP Readings from Last 3 Encounters:  12/01/23 126/87  08/05/23 125/78  04/03/23 136/71   Wt Readings from Last 3 Encounters:  12/01/23 (!) 306 lb 9.6 oz (139.1 kg)  08/05/23 (!) 303 lb 6.4 oz (137.6 kg)  04/03/23 293 lb (132.9 kg)      Physical Exam   Physical Exam GENERAL: No acute distress, not ill appearing. ABDOMEN: Soft, mild right lower quadrant tenderness to deep palpation, not distended, normal bowel sounds throughout.   No results found for any visits on 12/01/23.  Assessment & Plan     Assessment and Plan Assessment & Plan Right lower quadrant abdominal pain, acute  Intermittent sharp pain in the right lower quadrant, lasting 3-4 minutes, occurring over the past year. Differential diagnosis includes appendicitis, ovarian cyst, hernia, and diverticulitis. Appendicitis is unlikely due to lack of systemic symptoms. Ovarian cysts and hernia are considered due to history and physical findings. No current pain or bulge noted. - Ordered CT of the abdomen and pelvis to evaluate appendix and ovaries - Advised to seek immediate care if symptoms of appendicitis develop  Class 3 obesity Chronic condition  Contributing to abdominal wall weakness and potential hernia formation. Discussed lifestyle modifications including reducing soda intake and increasing physical activity. - Encouraged reduction of soda intake to 1-2 cans per day - Advised on increasing  physical activity, such as walking - Discussed potential use of Ozempic  if diabetes is confirmed  Heavy menstrual bleeding Heavy menstrual bleeding, particularly during the first three days of the cycle, requiring use of both tampon and pad. Possible fibroid involvement. No current gynecological intervention. - Advised to discuss heavy menstrual bleeding with gynecologist  Suspected iron deficiency anemia Chronic  Low MCV of 77. No current iron supplementation. Plan to confirm with lab work. - Ordered CBC and CMP to assess iron levels and ferritin - Will prescribe iron supplements if iron deficiency is confirmed  Prediabetes Chronic  Previous A1c of 6.2. Discussed lifestyle changes to prevent progression to diabetes. She expresses reluctance to know current status but agrees to testing. - Ordered A1c test to assess current diabetic status - Discussed lifestyle modifications including diet and exercise  Return in about 3 months (around 03/02/2024) for prediabetes, RLQ abd pain .        Rockie Agent, MD  [facility] [phone] (phone) [phone] (fax)  [facility]

## 2023-12-02 ENCOUNTER — Encounter: Payer: Self-pay | Admitting: Family Medicine

## 2023-12-02 LAB — FERRITIN: Ferritin: 27 ng/mL (ref 15–150)

## 2023-12-02 LAB — COMPREHENSIVE METABOLIC PANEL WITH GFR
ALT: 10 IU/L (ref 0–32)
AST: 15 IU/L (ref 0–40)
Albumin: 4 g/dL (ref 3.9–4.9)
Alkaline Phosphatase: 150 IU/L — ABNORMAL HIGH (ref 41–116)
BUN/Creatinine Ratio: 10 (ref 9–23)
BUN: 8 mg/dL (ref 6–20)
Bilirubin Total: 0.3 mg/dL (ref 0.0–1.2)
CO2: 20 mmol/L (ref 20–29)
Calcium: 9.9 mg/dL (ref 8.7–10.2)
Chloride: 103 mmol/L (ref 96–106)
Creatinine, Ser: 0.78 mg/dL (ref 0.57–1.00)
Globulin, Total: 3.3 g/dL (ref 1.5–4.5)
Glucose: 98 mg/dL (ref 70–99)
Potassium: 4.5 mmol/L (ref 3.5–5.2)
Sodium: 141 mmol/L (ref 134–144)
Total Protein: 7.3 g/dL (ref 6.0–8.5)
eGFR: 100 mL/min/1.73 (ref >59)

## 2023-12-02 LAB — CBC WITH DIFFERENTIAL/PLATELET
Basophils Absolute: 0 x10E3/uL (ref 0.0–0.2)
Basos: 0 %
EOS (ABSOLUTE): 0.1 x10E3/uL (ref 0.0–0.4)
Eos: 2 %
Hematocrit: 39.4 % (ref 34.0–46.6)
Hemoglobin: 12.2 g/dL (ref 11.1–15.9)
Immature Grans (Abs): 0 x10E3/uL (ref 0.0–0.1)
Immature Granulocytes: 0 %
Lymphocytes Absolute: 2.4 x10E3/uL (ref 0.7–3.1)
Lymphs: 34 %
MCH: 24.3 pg — ABNORMAL LOW (ref 26.6–33.0)
MCHC: 31 g/dL — ABNORMAL LOW (ref 31.5–35.7)
MCV: 78 fL — ABNORMAL LOW (ref 79–97)
Monocytes Absolute: 0.3 x10E3/uL (ref 0.1–0.9)
Monocytes: 5 %
Neutrophils Absolute: 4.2 x10E3/uL (ref 1.4–7.0)
Neutrophils: 59 %
Platelets: 466 x10E3/uL — ABNORMAL HIGH (ref 150–450)
RBC: 5.03 x10E6/uL (ref 3.77–5.28)
RDW: 14.5 % (ref 11.7–15.4)
WBC: 7.1 x10E3/uL (ref 3.4–10.8)

## 2023-12-02 LAB — HEMOGLOBIN A1C
Est. average glucose Bld gHb Est-mCnc: 126 mg/dL
Hgb A1c MFr Bld: 6 % — ABNORMAL HIGH (ref 4.8–5.6)

## 2023-12-04 ENCOUNTER — Ambulatory Visit: Payer: Self-pay | Admitting: Family Medicine

## 2023-12-09 ENCOUNTER — Ambulatory Visit: Admission: RE | Admit: 2023-12-09 | Source: Ambulatory Visit

## 2023-12-17 ENCOUNTER — Encounter (HOSPITAL_COMMUNITY): Payer: Self-pay | Admitting: *Deleted

## 2023-12-17 ENCOUNTER — Emergency Department (HOSPITAL_COMMUNITY)

## 2023-12-17 ENCOUNTER — Emergency Department (HOSPITAL_BASED_OUTPATIENT_CLINIC_OR_DEPARTMENT_OTHER)
Admission: EM | Admit: 2023-12-17 | Discharge: 2023-12-17 | Disposition: A | Discharge status: Home / Self Care | Attending: Emergency Medicine | Admitting: Emergency Medicine

## 2023-12-17 ENCOUNTER — Encounter (HOSPITAL_BASED_OUTPATIENT_CLINIC_OR_DEPARTMENT_OTHER): Payer: Self-pay | Admitting: Emergency Medicine

## 2023-12-17 ENCOUNTER — Emergency Department (HOSPITAL_COMMUNITY)
Admission: EM | Admit: 2023-12-17 | Discharge: 2023-12-17 | Discharge status: Against Medical Advice | Attending: Emergency Medicine | Admitting: Emergency Medicine

## 2023-12-17 ENCOUNTER — Other Ambulatory Visit (HOSPITAL_COMMUNITY): Payer: Self-pay

## 2023-12-17 ENCOUNTER — Other Ambulatory Visit: Payer: Self-pay

## 2023-12-17 DIAGNOSIS — M25571 Pain in right ankle and joints of right foot: Secondary | ICD-10-CM | POA: Diagnosis not present

## 2023-12-17 DIAGNOSIS — W19XXXA Unspecified fall, initial encounter: Secondary | ICD-10-CM | POA: Insufficient documentation

## 2023-12-17 DIAGNOSIS — M25561 Pain in right knee: Secondary | ICD-10-CM | POA: Diagnosis not present

## 2023-12-17 DIAGNOSIS — S99911A Unspecified injury of right ankle, initial encounter: Secondary | ICD-10-CM | POA: Diagnosis not present

## 2023-12-17 DIAGNOSIS — S80212A Abrasion, left knee, initial encounter: Secondary | ICD-10-CM

## 2023-12-17 DIAGNOSIS — Z0189 Encounter for other specified special examinations: Secondary | ICD-10-CM | POA: Diagnosis not present

## 2023-12-17 DIAGNOSIS — S80211A Abrasion, right knee, initial encounter: Secondary | ICD-10-CM | POA: Diagnosis not present

## 2023-12-17 DIAGNOSIS — Z5321 Procedure and treatment not carried out due to patient leaving prior to being seen by health care provider: Secondary | ICD-10-CM | POA: Diagnosis not present

## 2023-12-17 DIAGNOSIS — S93401A Sprain of unspecified ligament of right ankle, initial encounter: Secondary | ICD-10-CM

## 2023-12-17 DIAGNOSIS — M7989 Other specified soft tissue disorders: Secondary | ICD-10-CM | POA: Diagnosis not present

## 2023-12-17 DIAGNOSIS — Y92009 Unspecified place in unspecified non-institutional (private) residence as the place of occurrence of the external cause: Secondary | ICD-10-CM | POA: Diagnosis not present

## 2023-12-17 DIAGNOSIS — R609 Edema, unspecified: Secondary | ICD-10-CM | POA: Diagnosis not present

## 2023-12-17 DIAGNOSIS — W010XXA Fall on same level from slipping, tripping and stumbling without subsequent striking against object, initial encounter: Secondary | ICD-10-CM | POA: Diagnosis not present

## 2023-12-17 MED ORDER — OXYCODONE-ACETAMINOPHEN 5-325 MG PO TABS
1.0000 | ORAL_TABLET | Freq: Once | ORAL | Status: AC
  Administered 2023-12-17: 1 via ORAL
  Filled 2023-12-17: qty 1

## 2023-12-17 MED ORDER — IBUPROFEN 400 MG PO TABS
400.0000 mg | ORAL_TABLET | Freq: Once | ORAL | Status: AC
  Administered 2023-12-17: 400 mg via ORAL
  Filled 2023-12-17: qty 1

## 2023-12-17 MED ORDER — BACITRACIN ZINC 500 UNIT/GM EX OINT
TOPICAL_OINTMENT | Freq: Two times a day (BID) | CUTANEOUS | Status: DC
  Filled 2023-12-17: qty 56.7

## 2023-12-17 MED ORDER — IBUPROFEN 600 MG PO TABS: 600.0000 mg | ORAL_TABLET | Freq: Four times a day (QID) | ORAL | 0 refills | Status: AC | PRN

## 2023-12-17 NOTE — Discharge Instructions (Addendum)
 You were seen today after a fall.  Your x-rays do not show any evidence of broken bones.  Ice and elevate the ankle.  Take ibuprofen  for pain.  Apply bacitracin ointment to the left knee.  You may ambulate as tolerated.

## 2023-12-17 NOTE — ED Provider Notes (Signed)
 [facility] EMERGENCY DEPARTMENT AT [facility] Provider Note   CSN: [id number] Arrival date & time: 12/17/23  9392     Patient presents with: Sherry [name]   Sherry [name] is a 38 y.o. female.   HPI     This is a 38 year old female who presents after a fall.  Patient reports that she tripped and fell coming out of her house onto some concrete steps.  She did not hit her head or lose consciousness.  She states that her right ankle gave out underneath her.  She is complaining mostly of right ankle pain and left knee pain.  She has difficulty bearing weight on the right ankle secondary to pain.  She has not taken anything for pain.  Prior to Admission medications   Medication Sig Start Date End Date Taking? Authorizing Provider  ibuprofen  (ADVIL ) 600 MG tablet Take 1 tablet (600 mg total) by mouth every 6 (six) hours as needed. 12/17/23  Yes Claude Waldman, Charmaine FALCON, MD  meloxicam  (MOBIC ) 15 MG tablet Take 1 tablet (15 mg total) by mouth daily. 08/05/23   Simmons-Robinson, Rockie, MD  valACYclovir  (VALTREX ) 1000 MG tablet Take 1 tablet (1,000 mg total) by mouth daily. Take for 5 days 01/01/23   Anyanwu, Ugonna A, MD  valACYclovir  (VALTREX ) 500 MG tablet Take 1 tablet (500 mg total) by mouth daily. Can increase to twice a day for 5 days in the event of a recurrence 01/12/23   Anyanwu, Gloris LABOR, MD    Allergies: Patient has no known allergies.    Review of Systems  Constitutional:  Negative for fever.  Musculoskeletal:        Right ankle pain, left knee pain  All other systems reviewed and are negative.   Updated Vital Signs Temp 98.2 F (36.8 C) (Oral)   Resp 18   Wt (!) 139.1 kg   LMP 11/15/2023 (Exact Date)   BMI 44.00 kg/m   Physical Exam Vitals and nursing note reviewed.  Constitutional:      Appearance: She is well-developed. She is obese. She is not ill-appearing.  HENT:     Head: Normocephalic and atraumatic.  Eyes:     Pupils: Pupils are equal, round, and reactive to  light.  Cardiovascular:     Rate and Rhythm: Normal rate and regular rhythm.  Pulmonary:     Effort: Pulmonary effort is normal. No respiratory distress.  Musculoskeletal:     Cervical back: Neck supple.     Comments: Focused examination of the lower extremities reveals tenderness palpation of the bilateral malleolus of the right ankle, slight swelling noted, neurovascularly intact, no proximal fibular tenderness Left knee with abrasion noted, limited range of motion secondary to pain, no joint laxity noted, vascular intact with 2+ DP pulse bilaterally  Skin:    General: Skin is warm and dry.  Neurological:     Mental Status: She is alert and oriented to person, place, and time.  Psychiatric:        Mood and Affect: Mood normal.     (all labs ordered are listed, but only abnormal results are displayed) Labs Reviewed - No data to display  EKG: None  Radiology: DG Ankle Complete Right Result Date: 12/17/2023 EXAM: 3 OR MORE VIEW(S) XRAY OF THE RIGHT ANKLE 12/17/2023 02:00:00 AM CLINICAL HISTORY: rt foot and rt ankle COMPARISON: None available. FINDINGS: BONES AND JOINTS: No acute fracture. No malalignment. SOFT TISSUES: Mild diffuse subcutaneous edema. IMPRESSION: 1. No acute fracture or malalignment. Electronically signed by: Dorethia  Parikh MD 12/17/2023 02:10 AM EST RP Workstation: [id number]   DG Knee Complete 4 Views Left Result Date: 12/17/2023 EXAM: 4 VIEW(S) XRAY OF THE LEFT KNEE 12/17/2023 01:58:00 AM COMPARISON: None available. CLINICAL HISTORY: rt foot and rt ankle FINDINGS: BONES AND JOINTS: No acute fracture. No malalignment. No significant joint effusion. No significant degenerative changes. SOFT TISSUES: The soft tissues are unremarkable. IMPRESSION: 1. No acute findings. Electronically signed by: Dorethia Molt MD 12/17/2023 02:08 AM EST RP Workstation: [id number]   DG Foot Complete Right Result Date: 12/17/2023 EXAM: 3 OR MORE VIEW(S) XRAY OF THE RIGHT FOOT 12/17/2023  01:45:30 AM COMPARISON: None available. CLINICAL HISTORY: rt foot and rt ankle FINDINGS: BONES AND JOINTS: No acute fracture. No malalignment. SOFT TISSUES: Soft tissue swelling at the forefoot. IMPRESSION: 1. No acute fracture. Electronically signed by: Norman Gatlin MD 12/17/2023 01:55 AM EST RP Workstation: [id number]     Procedures   Medications Ordered in the ED  ibuprofen  (ADVIL ) tablet 400 mg (has no administration in time range)  oxyCODONE -acetaminophen  (PERCOCET/ROXICET) 5-325 MG per tablet 1 tablet (has no administration in time range)  bacitracin ointment (has no administration in time range)                                    Medical Decision Making Risk OTC drugs. Prescription drug management.   This patient presents to the ED for concern of fall, this involves an extensive number of treatment options, and is a complaint that carries with it a high risk of complications and morbidity.  I considered the following differential and admission for this acute, potentially life threatening condition.  The differential diagnosis includes fracture, sprain, abrasion, contusion  MDM:    This is a 38 year old female who presents with right ankle pain and left knee pain.  She is nontoxic and vital signs are reassuring.  She has abrasion to the left knee.  She has some swelling to the right ankle.  X-rays reviewed from [facility].  Negative for acute fracture.  Suspect sprain and abrasion.  Recommend supportive measures including NSAIDs, ice.  Bacitracin to the left knee.  Tetanus is up-to-date.  (Labs, imaging, consults)  Labs: I Ordered, and personally interpreted labs.  The pertinent results include: None  Imaging Studies ordered: I ordered imaging studies including x-rays right ankle, foot, left knee I independently visualized and interpreted imaging. I agree with the radiologist interpretation  Additional history obtained from chart review.  External records from outside  source obtained and reviewed including prior evaluations  Cardiac Monitoring: The patient was not maintained on a cardiac monitor.  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    Dispense:  30 tablet    Refill:  0    I have reviewed the patients home medicines and have made adjustments as needed  Problem List / ED Course: Problem List Items Addressed This Visit   None Visit Diagnoses       Sprain of right ankle, unspecified ligament, initial encounter    -  Primary     Abrasion of left knee, initial encounter                    Final diagnoses:  Sprain of right ankle, unspecified ligament, initial encounter  Abrasion of left knee, initial encounter    ED Discharge Orders          Ordered    ibuprofen  (ADVIL ) 600 MG tablet  Every 6 hours PRN        12/17/23 0619               Bari Charmaine FALCON, MD 12/17/23 [phone]

## 2023-12-17 NOTE — ED Notes (Signed)
 Pt was seen leaving ED with Mother.

## 2023-12-17 NOTE — ED Triage Notes (Signed)
 POV/ ambulatory/ right ankle pain after fall today/ also c/o right knee pain

## 2023-12-17 NOTE — ED Notes (Signed)
Pt was seen leaving ED.  

## 2023-12-17 NOTE — ED Triage Notes (Signed)
 Pt in with pain and abrasion to L knee, pain and swelling to R ankle, sustained after twisting ankle and falling last night vs concrete. Pt denies any other trauma, states she left [facility] tonight and had all imaging done there, just wants results

## 2024-01-13 DIAGNOSIS — F4320 Adjustment disorder, unspecified: Secondary | ICD-10-CM | POA: Diagnosis not present

## 2024-03-07 ENCOUNTER — Ambulatory Visit: Admitting: Family Medicine
